# Patient Record
Sex: Female | Born: 1985 | Race: Black or African American | Hispanic: No | Marital: Married | Smoking: Current every day smoker
Health system: Southern US, Community
[De-identification: ages and names within clinical notes are randomized; demographics above are authoritative.]

## PROBLEM LIST (undated history)

## (undated) DIAGNOSIS — G43909 Migraine, unspecified, not intractable, without status migrainosus: Secondary | ICD-10-CM

## (undated) HISTORY — PX: ECTOPIC PREGNANCY SURGERY: SHX613

---

## 2021-02-09 ENCOUNTER — Emergency Department (HOSPITAL_BASED_OUTPATIENT_CLINIC_OR_DEPARTMENT_OTHER)
Admission: EM | Admit: 2021-02-09 | Discharge: 2021-02-10 | Disposition: A | Discharge status: Home / Self Care | Payer: Medicaid - Out of State | Attending: Emergency Medicine | Admitting: Emergency Medicine

## 2021-02-09 ENCOUNTER — Encounter (HOSPITAL_BASED_OUTPATIENT_CLINIC_OR_DEPARTMENT_OTHER): Payer: Self-pay | Admitting: Emergency Medicine

## 2021-02-09 ENCOUNTER — Other Ambulatory Visit: Payer: Self-pay

## 2021-02-09 DIAGNOSIS — F17210 Nicotine dependence, cigarettes, uncomplicated: Secondary | ICD-10-CM | POA: Diagnosis not present

## 2021-02-09 DIAGNOSIS — R519 Headache, unspecified: Secondary | ICD-10-CM | POA: Diagnosis present

## 2021-02-09 DIAGNOSIS — U071 COVID-19: Secondary | ICD-10-CM | POA: Insufficient documentation

## 2021-02-09 LAB — PREGNANCY, URINE: Preg Test, Ur: NEGATIVE

## 2021-02-09 LAB — RESP PANEL BY RT-PCR (FLU A&B, COVID) ARPGX2
Influenza A by PCR: NEGATIVE
Influenza B by PCR: NEGATIVE
SARS Coronavirus 2 by RT PCR: POSITIVE — AB

## 2021-02-09 MED ORDER — ONDANSETRON 4 MG PO TBDP: 4.0000 mg | ORAL_TABLET | Freq: Three times a day (TID) | ORAL | 0 refills | Status: DC | PRN

## 2021-02-09 MED ORDER — BENZONATATE 100 MG PO CAPS: 100.0000 mg | ORAL_CAPSULE | Freq: Three times a day (TID) | ORAL | 0 refills | Status: DC

## 2021-02-09 MED ORDER — KETOROLAC TROMETHAMINE 30 MG/ML IJ SOLN
30.0000 mg | Freq: Once | INTRAMUSCULAR | Status: AC
  Administered 2021-02-09: 30 mg via INTRAMUSCULAR
  Filled 2021-02-09: qty 1

## 2021-02-09 MED ORDER — NAPROXEN 500 MG PO TABS: 500.0000 mg | ORAL_TABLET | Freq: Two times a day (BID) | ORAL | 0 refills | Status: DC

## 2021-02-09 NOTE — Discharge Instructions (Addendum)
As discussed, your COVID test is positive.  I suspect your symptoms are related to COVID infection.  I am sending you home with symptomatic treatment.  Take as prescribed.  You must quarantine for 7 days away from others.  Please follow-up with PCP if symptoms not improve.  Return to the ER for new or worsening symptoms.

## 2021-02-09 NOTE — ED Notes (Signed)
Today per Pt. At approx. 1400 the Pt. Reports her head started to hurt and now her body hurts.  Pt. Crying and states she has been hurting so bad.  Pt. Continues to speak non stop about how bad she is hurting.  Pt. Lying on her stomach and asking her son to rub her back.  Pt. Up on her knees on the stretcher asking her son to rub her back.

## 2021-02-09 NOTE — ED Notes (Signed)
Pt. Is being very uncooperative and crying about everything that she wants and does not want.  Pt. Wants a hot rag and RN explained that I can't do that unless she has an order for the HOT RAG due to unsure if that is what the Dr. Dewayne Hatch for her needs.  Explained to the Pt. I will get her a warm blanket.    Pt. Was helped to the rest room for a UA sample.  Pt. Has no issues with walking. Pt. Refused a wheelchair to the restroom and refused the Migraine Cocktail offered to her.  Pt. Is in a tearful state and asking for medicine .  Explained to her she will receive meds asap.

## 2021-02-09 NOTE — ED Notes (Signed)
ED Provider at bedside. 

## 2021-02-09 NOTE — ED Notes (Signed)
Pt. Has asked for a hot rag and RN explained that the Dr. Stann Mainland be in to see her.

## 2021-02-09 NOTE — ED Provider Notes (Signed)
[facility] EMERGENCY DEPARTMENT Provider Note   CSN: [id number] Arrival date & time: 02/09/21  2134     History Chief Complaint  Patient presents with  . Flu like symptoms    Dawn Bowman is a 35 y.o. female with no significant past medical history who presents to the ED due to myalgias, headache, chills, and decreased appetite that started earlier today.  Patient states she works at [employer] and was at orientation today when she began to feel acutely ill.  Denies sick contacts known COVID exposures.  Denies chest pain and shortness of breath.  Patient states she is having lower extremity myalgias.  She also admits to a dry cough and sore throat.  He is currently vaccinated against COVID-19 however, has not received her booster shot.  Denies abdominal pain, nausea, vomiting, diarrhea.  No treatment prior to arrival.  No aggravating or alleviating symptoms.  History obtained from patient and past medical records. No interpreter used during encounter.       History reviewed. No pertinent past medical history.  There are no problems to display for this patient.   History reviewed. No pertinent surgical history.   OB History   No obstetric history on file.     No family history on file.  Social History   Tobacco Use  . Smoking status: Current Every Day Smoker    Packs/day: 0.50    Types: Cigarettes  . Smokeless tobacco: Never Used  Vaping Use  . Vaping Use: Never used  Substance Use Topics  . Alcohol use: Not Currently  . Drug use: Never    Home Medications Prior to Admission medications   Medication Sig Start Date End Date Taking? Authorizing Provider  benzonatate (TESSALON) 100 MG capsule Take 1 capsule (100 mg total) by mouth every 8 (eight) hours. 02/09/21  Yes Marveline Profeta C, PA-C  naproxen (NAPROSYN) 500 MG tablet Take 1 tablet (500 mg total) by mouth 2 (two) times daily. 02/09/21  Yes Sriya Kroeze C, PA-C  ondansetron (ZOFRAN ODT) 4 MG  disintegrating tablet Take 1 tablet (4 mg total) by mouth every 8 (eight) hours as needed for nausea or vomiting. 02/09/21  Yes Mannie Stabile, PA-C    Allergies    Patient has no known allergies.  Review of Systems   Review of Systems  Constitutional: Positive for appetite change and chills. Negative for fever.  Respiratory: Negative for shortness of breath.   Cardiovascular: Negative for chest pain.  Gastrointestinal: Negative for abdominal pain, diarrhea, nausea and vomiting.  Musculoskeletal: Positive for myalgias.  Neurological: Positive for headaches.  All other systems reviewed and are negative.   Physical Exam Updated Vital Signs BP 130/90 (BP Location: Right Arm)   Pulse 97   Temp 99.3 F (37.4 C) (Oral)   Resp 20   Ht 5\' 11"  (1.803 m)   Wt 127 kg   SpO2 98%   BMI 39.05 kg/m   Physical Exam Vitals and nursing note reviewed.  Constitutional:      General: She is not in acute distress.    Appearance: She is not ill-appearing.  HENT:     Head: Normocephalic.     Mouth/Throat:     Comments: Posterior oropharynx clear and mucous membranes moist, there is mild erythema but no edema or tonsillar exudates, uvula midline, normal phonation, no trismus, tolerating secretions without difficulty. Eyes:     Pupils: Pupils are equal, round, and reactive to light.  Cardiovascular:     Rate  and Rhythm: Normal rate and regular rhythm.     Pulses: Normal pulses.     Heart sounds: Normal heart sounds. No murmur heard. No friction rub. No gallop.   Pulmonary:     Effort: Pulmonary effort is normal.     Breath sounds: Normal breath sounds.     Comments: Respirations equal and unlabored, patient able to speak in full sentences, lungs clear to auscultation bilaterally Abdominal:     General: Abdomen is flat. There is no distension.     Palpations: Abdomen is soft.     Tenderness: There is no abdominal tenderness. There is no guarding or rebound.     Comments: Abdomen soft,  nondistended, nontender to palpation in all quadrants without guarding or peritoneal signs. No rebound.   Musculoskeletal:        General: Normal range of motion.     Cervical back: Neck supple.  Skin:    General: Skin is warm and dry.  Neurological:     General: No focal deficit present.     Mental Status: She is alert.  Psychiatric:        Mood and Affect: Mood normal.        Behavior: Behavior normal.     ED Results / Procedures / Treatments   Labs (all labs ordered are listed, but only abnormal results are displayed) Labs Reviewed  RESP PANEL BY RT-PCR (FLU A&B, COVID) ARPGX2 - Abnormal; Notable for the following components:      Result Value   SARS Coronavirus 2 by RT PCR POSITIVE (*)    All other components within normal limits  PREGNANCY, URINE    EKG None  Radiology No results found.  Procedures Procedures   Medications Ordered in ED Medications  ketorolac (TORADOL) 30 MG/ML injection 30 mg (30 mg Intramuscular Given 02/09/21 2312)    ED Course  I have reviewed the triage vital signs and the nursing notes.  Pertinent labs & imaging results that were available during my care of the patient were reviewed by me and considered in my medical decision making (see chart for details).  Clinical Course as of 02/09/21 2359  Tue Feb 09, 2021  2355 SARS Coronavirus 2 by RT PCR(!): POSITIVE [CA]    Clinical Course User Index [CA] Mannie Stabile, PA-C   MDM Rules/Calculators/A&P                         35 year old female presents to the ED due to myalgias, cough, sore throat and headache that started earlier today.  Denies sick contacts or known COVID exposures.  She is currently vaccinated against COVID-19 however, has not received her booster shot.  Upon arrival, stable vitals.  Patient nontoxic-appearing.  Physical exam reassuring.  Lungs clear to auscultation bilaterally.  Low suspicion for pneumonia.  Abdomen soft, nondistended, nontender.  Low suspicion for  acute abdomen.  Suspect symptoms related to viral etiology.  COVID/influenza test ordered.  Offered patient migraine cocktail for headache however, she declined.  Toradol given for symptomatic relief.  COVID positive. Suspect symptoms related to COVID infection. No signs of hypoxia during patient's ED stay. Patient notes improvement after Toradol. Patient discharged with symptomatic treatment. Strict ED precautions discussed with patient. Patient states understanding and agrees to plan. Patient discharged home in no acute distress and stable vitals  Dawn Bowman was evaluated in Emergency Department on 02/09/2021 for the symptoms described in the history of present illness. She was evaluated in  the context of the global COVID-19 pandemic, which necessitated consideration that the patient might be at risk for infection with the SARS-CoV-2 virus that causes COVID-19. Institutional protocols and algorithms that pertain to the evaluation of patients at risk for COVID-19 are in a state of rapid change based on information released by regulatory bodies including the CDC and federal and state organizations. These policies and algorithms were followed during the patient's care in the ED.  Final Clinical Impression(s) / ED Diagnoses Final diagnoses:  COVID-19 virus infection    Rx / DC Orders ED Discharge Orders         Ordered    ondansetron (ZOFRAN ODT) 4 MG disintegrating tablet  Every 8 hours PRN        02/09/21 2357    naproxen (NAPROSYN) 500 MG tablet  2 times daily        02/09/21 2357    benzonatate (TESSALON) 100 MG capsule  Every 8 hours        02/09/21 2357           Mannie Stabile, PA-C 02/10/21 0000    Tegeler, Canary Brim, MD 02/10/21 0006

## 2021-02-09 NOTE — ED Triage Notes (Signed)
Patient arrived via POV c/o flu like symptoms x 12 hrs. Patient states headache, chills, generalized body aches. Patient states loss of appetite. Patient is AO x 4, VS WDL, unsteady gait.

## 2021-02-10 ENCOUNTER — Other Ambulatory Visit: Payer: Self-pay | Admitting: Adult Health

## 2021-02-10 ENCOUNTER — Other Ambulatory Visit (HOSPITAL_BASED_OUTPATIENT_CLINIC_OR_DEPARTMENT_OTHER): Payer: Self-pay

## 2021-02-10 DIAGNOSIS — U071 COVID-19: Secondary | ICD-10-CM

## 2021-02-10 MED ORDER — MOLNUPIRAVIR EUA 200MG CAPSULE
4.0000 | ORAL_CAPSULE | Freq: Two times a day (BID) | ORAL | 0 refills | Status: AC
  Filled 2021-02-10: qty 40, 5d supply, fill #0

## 2021-02-10 NOTE — Progress Notes (Signed)
Outpatient Oral COVID Treatment Note  I connected with Dawn Bowman on 02/10/2021/11:39 AM by telephone and verified that I am speaking with the correct person using two identifiers.  I discussed the limitations, risks, security, and privacy concerns of performing an evaluation and management service by telephone and the availability of in person appointments. I also discussed with the patient that there may be a patient responsible charge related to this service. The patient expressed understanding and agreed to proceed.  Patient location: home Provider location: [facility]  Diagnosis: COVID-19 infection  Purpose of visit: Discussion of potential use of Molnupiravir or Paxlovid, a new treatment for mild to moderate COVID-19 viral infection in non-hospitalized patients.   Subjective: Patient is a 35 y.o. female who has been diagnosed with COVID 19 viral infection.  Their symptoms began on 02/09/2021 with myalgias.    No past medical history on file.  No Known Allergies   Current Outpatient Medications:  .  benzonatate (TESSALON) 100 MG capsule, Take 1 capsule (100 mg total) by mouth every 8 (eight) hours., Disp: 21 capsule, Rfl: 0 .  naproxen (NAPROSYN) 500 MG tablet, Take 1 tablet (500 mg total) by mouth 2 (two) times daily., Disp: 30 tablet, Rfl: 0 .  ondansetron (ZOFRAN ODT) 4 MG disintegrating tablet, Take 1 tablet (4 mg total) by mouth every 8 (eight) hours as needed for nausea or vomiting., Disp: 20 tablet, Rfl: 0  Objective: Patient appears/sounds moderately well.  They are in no apparent distress.  Breathing is non labored.  Mood and behavior are normal.  Laboratory Data:  Recent Results (from the past 2160 hour(s))  Resp Panel by RT-PCR (Flu A&B, Covid) Nasopharyngeal Swab     Status: Abnormal   Collection Time: 02/09/21 10:31 PM   Specimen: Nasopharyngeal Swab; Nasopharyngeal(NP) swabs in vial transport medium  Result Value Ref Range   SARS Coronavirus 2 by RT PCR POSITIVE  (A) NEGATIVE    Comment: RESULT CALLED TO, READ BACK BY AND VERIFIED WITH: NEAL KELLIE RN AT 2341 ON 02/09/21 BY I.SUGUT (NOTE) SARS-CoV-2 target nucleic acids are DETECTED.  The SARS-CoV-2 RNA is generally detectable in upper respiratory specimens during the acute phase of infection. Positive results are indicative of the presence of the identified virus, but do not rule out bacterial infection or co-infection with other pathogens not detected by the test. Clinical correlation with patient history and other diagnostic information is necessary to determine patient infection status. The expected result is Negative.  Fact Sheet for Patients: BloggerCourse.com  Fact Sheet for Healthcare Providers: SeriousBroker.it  This test is not yet approved or cleared by the [place] FDA and  has been authorized for detection and/or diagnosis of SARS-CoV-2 by FDA under an Emergency Use Authorization (EUA).  This EUA will remain in effect (meaning this t est can be used) for the duration of  the COVID-19 declaration under Section 564(b)(1) of the Act, 21 U.S.C. section 360bbb-3(b)(1), unless the authorization is terminated or revoked sooner.     Influenza A by PCR NEGATIVE NEGATIVE   Influenza B by PCR NEGATIVE NEGATIVE    Comment: (NOTE) The Xpert Xpress SARS-CoV-2/FLU/RSV plus assay is intended as an aid in the diagnosis of influenza from Nasopharyngeal swab specimens and should not be used as a sole basis for treatment. Nasal washings and aspirates are unacceptable for Xpert Xpress SARS-CoV-2/FLU/RSV testing.  Fact Sheet for Patients: BloggerCourse.com  Fact Sheet for Healthcare Providers: SeriousBroker.it  This test is not yet approved or cleared by the [place]  States FDA and has been authorized for detection and/or diagnosis of SARS-CoV-2 by FDA under an Emergency Use Authorization  (EUA). This EUA will remain in effect (meaning this test can be used) for the duration of the COVID-19 declaration under Section 564(b)(1) of the Act, 21 U.S.C. section 360bbb-3(b)(1), unless the authorization is terminated or revoked.  Performed at [facility], [address] [place], [place] [address]   Pregnancy, urine     Status: None   Collection Time: 02/09/21 10:31 PM  Result Value Ref Range   Preg Test, Ur NEGATIVE NEGATIVE    Comment:        THE SENSITIVITY OF THIS METHODOLOGY IS >20 mIU/mL. Performed at [facility], [address] [place], [place] [address]      Assessment: 35 y.o. female with mild/moderate COVID 19 viral infection diagnosed on 02/08/2021 at high risk for progression to severe COVID 19.  Plan:  This patient is a 35 y.o. female that meets the following criteria for Emergency Use Authorization of: Molnupiravir  1. Age >18 yr 2. SARS-COV-2 positive test 3. Symptom onset < 5 days 4. Mild-to-moderate COVID disease with high risk for severe progression to hospitalization or death   I have spoken and communicated the following to the patient or parent/caregiver regarding: 1. Molnupiravir is an unapproved drug that is authorized for use under an TEFL teacher.  2. There are no adequate, approved, available products for the treatment of COVID-19 in adults who have mild-to-moderate COVID-19 and are at high risk for progressing to severe COVID-19, including hospitalization or death. 3. Other therapeutics are currently authorized. For additional information on all products authorized for treatment or prevention of COVID-19, please see https://www.graham-miller.com/.  4. There are benefits and risks of taking this treatment as outlined in the "Fact Sheet for Patients and Caregivers."  5. "Fact Sheet for Patients and Caregivers" was  reviewed with patient. A hard copy will be provided to patient from pharmacy prior to the patient receiving treatment. 6. Patients should continue to self-isolate and use infection control measures (e.g., wear mask, isolate, social distance, avoid sharing personal items, clean and disinfect "high touch" surfaces, and frequent handwashing) according to CDC guidelines.  7. The patient or parent/caregiver has the option to accept or refuse treatment. 8. [employer] has established a pregnancy surveillance program. 9. Females of childbearing potential should use a reliable method of contraception correctly and consistently, as applicable, for the duration of treatment and for 4 days after the last dose of Molnupiravir. 10. Males of reproductive potential who are sexually active with females of childbearing potential should use a reliable method of contraception correctly and consistently during treatment and for at least 3 months after the last dose. 11. Pregnancy status and risk was assessed. Patient verbalized understanding of precautions.   After reviewing above information with the patient, the patient agrees to receive molnupiravir.  Follow up instructions:    . Take prescription BID x 5 days as directed . Reach out to pharmacist for counseling on medication if desired . For concerns regarding further COVID symptoms please follow up with your PCP or urgent care . For urgent or life-threatening issues, seek care at your local emergency department  The patient was provided an opportunity to ask questions, and all were answered. The patient agreed with the plan and demonstrated an understanding of the instructions.   Script sent to [facility] and opted to pick up RX.  The patient was advised to call their PCP or seek an in-person evaluation if the symptoms worsen or if the condition fails to improve as anticipated.   I provided 15 minutes of non face-to-face telephone visit  time during this encounter, and > 50% was spent counseling as documented under my assessment & plan.  Noreene Filbert, NP 02/10/2021 /11:39 AM

## 2021-02-17 ENCOUNTER — Other Ambulatory Visit (HOSPITAL_BASED_OUTPATIENT_CLINIC_OR_DEPARTMENT_OTHER): Payer: Self-pay

## 2021-04-07 ENCOUNTER — Emergency Department (HOSPITAL_BASED_OUTPATIENT_CLINIC_OR_DEPARTMENT_OTHER)
Admission: EM | Admit: 2021-04-07 | Discharge: 2021-04-07 | Disposition: A | Discharge status: Home / Self Care | Payer: Medicaid - Out of State | Attending: Emergency Medicine | Admitting: Emergency Medicine

## 2021-04-07 ENCOUNTER — Other Ambulatory Visit: Payer: Self-pay

## 2021-04-07 ENCOUNTER — Encounter (HOSPITAL_BASED_OUTPATIENT_CLINIC_OR_DEPARTMENT_OTHER): Payer: Self-pay

## 2021-04-07 DIAGNOSIS — X58XXXA Exposure to other specified factors, initial encounter: Secondary | ICD-10-CM | POA: Insufficient documentation

## 2021-04-07 DIAGNOSIS — B9689 Other specified bacterial agents as the cause of diseases classified elsewhere: Secondary | ICD-10-CM | POA: Insufficient documentation

## 2021-04-07 DIAGNOSIS — L089 Local infection of the skin and subcutaneous tissue, unspecified: Secondary | ICD-10-CM | POA: Insufficient documentation

## 2021-04-07 DIAGNOSIS — F17210 Nicotine dependence, cigarettes, uncomplicated: Secondary | ICD-10-CM | POA: Diagnosis not present

## 2021-04-07 DIAGNOSIS — R102 Pelvic and perineal pain: Secondary | ICD-10-CM | POA: Diagnosis not present

## 2021-04-07 DIAGNOSIS — S6992XA Unspecified injury of left wrist, hand and finger(s), initial encounter: Secondary | ICD-10-CM | POA: Insufficient documentation

## 2021-04-07 DIAGNOSIS — N760 Acute vaginitis: Secondary | ICD-10-CM | POA: Insufficient documentation

## 2021-04-07 LAB — URINALYSIS, ROUTINE W REFLEX MICROSCOPIC
Bilirubin Urine: NEGATIVE
Glucose, UA: NEGATIVE mg/dL
Hgb urine dipstick: NEGATIVE
Ketones, ur: NEGATIVE mg/dL
Leukocytes,Ua: NEGATIVE
Nitrite: NEGATIVE
Protein, ur: NEGATIVE mg/dL
Specific Gravity, Urine: >1.03 — ABNORMAL HIGH (ref 1.005–1.030)
pH: 6 (ref 5.0–8.0)

## 2021-04-07 LAB — WET PREP, GENITAL
Sperm: NONE SEEN
Trich, Wet Prep: NONE SEEN
Yeast Wet Prep HPF POC: NONE SEEN

## 2021-04-07 LAB — PREGNANCY, URINE: Preg Test, Ur: NEGATIVE

## 2021-04-07 MED ORDER — METRONIDAZOLE 500 MG PO TABS: 500.0000 mg | ORAL_TABLET | Freq: Two times a day (BID) | ORAL | 0 refills | Status: DC

## 2021-04-07 MED ORDER — CEPHALEXIN 250 MG PO CAPS
500.0000 mg | ORAL_CAPSULE | Freq: Once | ORAL | Status: AC
  Administered 2021-04-07: 500 mg via ORAL
  Filled 2021-04-07: qty 2

## 2021-04-07 MED ORDER — CEPHALEXIN 500 MG PO CAPS: 500.0000 mg | ORAL_CAPSULE | Freq: Two times a day (BID) | ORAL | 0 refills | Status: DC

## 2021-04-07 NOTE — ED Provider Notes (Signed)
[facility] EMERGENCY DEPARTMENT Provider Note   CSN: [id number] Arrival date & time: 04/07/21  0050     History Chief Complaint  Patient presents with   Pelvic Pain   Finger Injury    Dawn Bowman is a 35 y.o. female.  The history is provided by the patient.  Vaginal Discharge Severity:  Mild Onset quality:  Gradual Duration:  2 days Timing:  Intermittent Progression:  Worsening Chronicity:  New Relieved by:  Nothing Worsened by:  Nothing Associated symptoms: vaginal itching   Associated symptoms: no fever   Patient presents for 2 complaints.  She reports for the past 2 days she has had symptoms of bacterial vaginosis.  She reports vaginal itching and discharge.  No bleeding.  She has had mild pelvic pain.  Patient also reports a lesion to her left index finger.  She had a callus on her finger and applied Compound W to the area. Now the  Area is swollen and tender.  No fevers     PMH-none Past Surgical History:  Procedure Laterality Date   CESAREAN SECTION     ECTOPIC PREGNANCY SURGERY       OB History   No obstetric history on file.     No family history on file.  Social History   Tobacco Use   Smoking status: Every Day    Packs/day: 0.50    Pack years: 0.00    Types: Cigarettes   Smokeless tobacco: Never  Vaping Use   Vaping Use: Never used  Substance Use Topics   Alcohol use: Yes    Comment: occ   Drug use: Never    Home Medications Prior to Admission medications   Medication Sig Start Date End Date Taking? Authorizing Provider  cephALEXin (KEFLEX) 500 MG capsule Take 1 capsule (500 mg total) by mouth 2 (two) times daily. 04/07/21  Yes Zadie Rhine, MD  metroNIDAZOLE (FLAGYL) 500 MG tablet Take 1 tablet (500 mg total) by mouth 2 (two) times daily. One po bid x 7 days 04/07/21  Yes Zadie Rhine, MD  benzonatate (TESSALON) 100 MG capsule Take 1 capsule (100 mg total) by mouth every 8 (eight) hours. 02/09/21   Mannie Stabile,  PA-C  naproxen (NAPROSYN) 500 MG tablet Take 1 tablet (500 mg total) by mouth 2 (two) times daily. 02/09/21   Mannie Stabile, PA-C  ondansetron (ZOFRAN ODT) 4 MG disintegrating tablet Take 1 tablet (4 mg total) by mouth every 8 (eight) hours as needed for nausea or vomiting. 02/09/21   Mannie Stabile, PA-C    Allergies    Patient has no known allergies.  Review of Systems   Review of Systems  Constitutional:  Negative for fever.  Genitourinary:  Positive for vaginal discharge.   Physical Exam Updated Vital Signs BP 121/70 (BP Location: Right Arm)   Pulse 69   Temp 98.1 F (36.7 C) (Oral)   Resp 16   Ht 1.803 m (5\' 11" )   Wt 126.6 kg   SpO2 99%   BMI 38.91 kg/m   Physical Exam CONSTITUTIONAL: Well developed/well nourished HEAD: Normocephalic/atraumatic EYES: EOMI ENMT: Mucous membranes moist NECK: supple no meningeal signs CV: S1/S2 noted, no murmurs/rubs/gallops noted LUNGS: Lungs are clear to auscultation bilaterally ABDOMEN: soft, nontender, no rebound or guarding, bowel sounds noted throughout abdomen GU: Pelvic deferred, patient self swab NEURO: Pt is awake/alert/appropriate, moves all extremitiesx4.  No facial droop.   EXTREMITIES: pulses normal/equal, full ROM, see photo below Mild tenderness noted to  left index finger.  There is no fluctuance.  No paronychia.  Tenderness is mostly over the callus.  No tenderness noted to the proximal aspect of the index finger. No crepitus is noted SKIN: warm, color normal, see photo below PSYCH: no abnormalities of mood noted, alert and oriented to situation  Patient gave verbal permission to utilize photo for medical documentation only The image was not stored on any personal device ED Results / Procedures / Treatments   Labs (all labs ordered are listed, but only abnormal results are displayed) Labs Reviewed  WET PREP, GENITAL - Abnormal; Notable for the following components:      Result Value   Clue Cells Wet Prep  HPF POC PRESENT (*)    WBC, Wet Prep HPF POC FEW (*)    All other components within normal limits  URINALYSIS, ROUTINE W REFLEX MICROSCOPIC - Abnormal; Notable for the following components:   APPearance HAZY (*)    Specific Gravity, Urine >1.030 (*)    All other components within normal limits  PREGNANCY, URINE  GC/CHLAMYDIA PROBE AMP ([place]) NOT AT [facility]    EKG None  Radiology No results found.  Procedures Procedures   Medications Ordered in ED Medications  cephALEXin (KEFLEX) capsule 500 mg (500 mg Oral Given 04/07/21 0540)    ED Course  I have reviewed the triage vital signs and the nursing notes.  Pertinent labs results that were available during my care of the patient were reviewed by me and considered in my medical decision making (see chart for details).    MDM Rules/Calculators/A&P                          The patient's BV, she self swab in the emergency department.  She has tolerated Flagyl in the past.  This will be given to patient for the next week.  Advised no alcohol consumption until she completes Flagyl  For the lesion on her finger, suspect this is a secondary infection from the recent callus.  She thought it got worse after applying Compound W to the lesion There is no fluctuance at this time, no paronychia to suggest need for I&D.  No evidence of felon.  No significant pain with passive flexion, low suspicion for tenosynovitis Plan to keep finger elevated, warm compresses, will start oral antibiotics with Keflex. We discussed strict return precautions Final Clinical Impression(s) / ED Diagnoses Final diagnoses:  BV (bacterial vaginosis)  Finger infection    Rx / DC Orders ED Discharge Orders          Ordered    metroNIDAZOLE (FLAGYL) 500 MG tablet  2 times daily        04/07/21 0500    cephALEXin (KEFLEX) 500 MG capsule  2 times daily        04/07/21 0500             Zadie Rhine, MD 04/07/21 [phone]

## 2021-04-07 NOTE — ED Notes (Signed)
Pt made aware repeat urine needed.

## 2021-04-07 NOTE — ED Triage Notes (Signed)
Pt presents with multiple complaints.   1) Pt states she had a callous on her L index finger that she applied Compound W to and the area turned white.   2) Pt reports pelvic pain and states she thinks she has BV. Pt reports vaginal itching and discharge.

## 2021-04-07 NOTE — ED Notes (Signed)
Pt self swabbed per EDP  

## 2021-04-07 NOTE — Discharge Instructions (Addendum)
Keep your finger clean and dry.  Please keep the finger elevated.  You can also place warm compresses on the wound several times a day. If there is any worsening pain, swelling in the next 2 days please return to the ER

## 2021-04-08 LAB — GC/CHLAMYDIA PROBE AMP (~~LOC~~) NOT AT ARMC
Chlamydia: NEGATIVE
Comment: NEGATIVE
Comment: NORMAL
Neisseria Gonorrhea: NEGATIVE

## 2021-08-11 ENCOUNTER — Emergency Department (HOSPITAL_BASED_OUTPATIENT_CLINIC_OR_DEPARTMENT_OTHER)
Admission: EM | Admit: 2021-08-11 | Discharge: 2021-08-11 | Disposition: A | Discharge status: Home / Self Care | Payer: Medicaid Other | Attending: Emergency Medicine | Admitting: Emergency Medicine

## 2021-08-11 ENCOUNTER — Other Ambulatory Visit: Payer: Self-pay

## 2021-08-11 ENCOUNTER — Encounter (HOSPITAL_BASED_OUTPATIENT_CLINIC_OR_DEPARTMENT_OTHER): Payer: Self-pay | Admitting: Emergency Medicine

## 2021-08-11 DIAGNOSIS — K029 Dental caries, unspecified: Secondary | ICD-10-CM | POA: Diagnosis not present

## 2021-08-11 DIAGNOSIS — K0889 Other specified disorders of teeth and supporting structures: Secondary | ICD-10-CM | POA: Diagnosis present

## 2021-08-11 DIAGNOSIS — F17210 Nicotine dependence, cigarettes, uncomplicated: Secondary | ICD-10-CM | POA: Diagnosis not present

## 2021-08-11 MED ORDER — AMOXICILLIN 500 MG PO CAPS
500.0000 mg | ORAL_CAPSULE | Freq: Once | ORAL | Status: AC
  Administered 2021-08-11: 500 mg via ORAL
  Filled 2021-08-11: qty 1

## 2021-08-11 MED ORDER — FLUCONAZOLE 150 MG PO TABS: ORAL_TABLET | ORAL | 0 refills | Status: AC

## 2021-08-11 MED ORDER — IBUPROFEN 800 MG PO TABS
800.0000 mg | ORAL_TABLET | Freq: Once | ORAL | Status: AC
  Administered 2021-08-11: 800 mg via ORAL
  Filled 2021-08-11: qty 1

## 2021-08-11 MED ORDER — IBUPROFEN 800 MG PO TABS: 800.0000 mg | ORAL_TABLET | Freq: Three times a day (TID) | ORAL | 0 refills | Status: AC | PRN

## 2021-08-11 MED ORDER — AMOXICILLIN 500 MG PO CAPS: 500.0000 mg | ORAL_CAPSULE | Freq: Three times a day (TID) | ORAL | 0 refills | Status: DC

## 2021-08-11 NOTE — ED Triage Notes (Signed)
Pt has 4 teeth on right side with pain .

## 2021-08-11 NOTE — ED Notes (Signed)
Discharge instructions discussed with pt. Pt verbalized understanding. Pt stable and ambulatory.  °

## 2021-08-11 NOTE — ED Provider Notes (Signed)
[facility] Provider Note: Lowella Dell, MD, FACEP  CSN: [id number] MRN: [id number] ARRIVAL: 08/11/21 at 0130 ROOM: [facility]07/[facility]07   CHIEF COMPLAINT  Dental Pain   HISTORY OF PRESENT ILLNESS  08/11/21 4:17 AM Dawn Bowman is a 35 y.o. 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  She is here with an exacerbation of pain in these teeth.  The pain is now radiating to the right side of the face.  She rates it as a 6 out of 10.  She is gotten partial relief with Aleve but recently moved to town and has no Radiation protection practitioner.   History reviewed. No pertinent past medical history.  Past Surgical History:  Procedure Laterality Date   CESAREAN SECTION     ECTOPIC PREGNANCY SURGERY      History reviewed. No pertinent family history.  Social History   Tobacco Use   Smoking status: Every Day    Packs/day: 0.50    Types: Cigarettes   Smokeless tobacco: Never  Vaping Use   Vaping Use: Never used  Substance Use Topics   Alcohol use: Yes    Comment: occ   Drug use: Never    Prior to Admission medications   Medication Sig Start Date End Date Taking? Authorizing Provider  amoxicillin (AMOXIL) 500 MG capsule Take 1 capsule (500 mg total) by mouth 3 (three) times daily. 08/11/21  Yes Maan Zarcone, MD  fluconazole (DIFLUCAN) 150 MG tablet Take 1 tablet as needed for vaginal yeast infection.  May repeat in 3 days if symptoms persist. 08/11/21  Yes Shelonda Saxe, Jonny Ruiz, MD  ibuprofen (ADVIL) 800 MG tablet Take 1 tablet (800 mg total) by mouth every 8 (eight) hours as needed (for pain). 08/11/21  Yes Caera Enwright, MD    Allergies Patient has no known allergies.   REVIEW OF SYSTEMS  Negative except as noted here or in the History of Present Illness.   PHYSICAL EXAMINATION  Initial Vital Signs Blood pressure (!) 143/109, pulse 80, temperature 98.7 F (37.1 C), temperature source Oral, resp. rate 18, height 5\' 11"  (1.803 m), weight  127 kg, SpO2 99 %.  Examination General: Well-developed, well-nourished female in no acute distress; appearance consistent with age of record HENT: normocephalic; atraumatic; carious right upper and lower molars Eyes: pupils equal, round and reactive to light; extraocular muscles intact Neck: supple Heart: regular rate and rhythm Lungs: clear to auscultation bilaterally Abdomen: soft; nondistended; nontender; bowel sounds present Extremities: No deformity; full range of motion Neurologic: Awake, alert and oriented; motor function intact in all extremities and symmetric; no facial droop Skin: Warm and dry Psychiatric: Normal mood and affect   RESULTS  Summary of this visit's results, reviewed and interpreted by myself:   EKG Interpretation  Date/Time:    Ventricular Rate:    PR Interval:    QRS Duration:   QT Interval:    QTC Calculation:   R Axis:     Text Interpretation:         Laboratory Studies: No results found for this or any previous visit (from the past 24 hour(s)). Imaging Studies: No results found.  ED COURSE and MDM  Nursing notes, initial and subsequent vitals signs, including pulse oximetry, reviewed and interpreted by myself.  Vitals:   08/11/21 0142 08/11/21 0149  BP:  (!) 143/109  Pulse:  80  Resp:  18  Temp:  98.7 F (37.1 C)  TempSrc:  Oral  SpO2:  99%  Weight: 127 kg   Height: 5\' 11"  (1.803 m)    Medications  amoxicillin (AMOXIL) capsule 500 mg (has no administration in time range)  ibuprofen (ADVIL) tablet 800 mg (has no administration in time range)    Patient would like to be started on antibiotics and ibuprofen.  She does not like narcotic pain medication.  We will refer her to the dentist and oral surgeon on-call.  PROCEDURES  Procedures   ED DIAGNOSES     ICD-10-CM   1. Pain due to dental caries  K02.9          Zoriyah Scheidegger, MD 08/11/21 0430

## 2021-10-26 ENCOUNTER — Emergency Department (HOSPITAL_BASED_OUTPATIENT_CLINIC_OR_DEPARTMENT_OTHER)
Admission: EM | Admit: 2021-10-26 | Discharge: 2021-10-26 | Disposition: A | Discharge status: Home / Self Care | Payer: Medicaid Other | Attending: Emergency Medicine | Admitting: Emergency Medicine

## 2021-10-26 ENCOUNTER — Other Ambulatory Visit: Payer: Self-pay

## 2021-10-26 ENCOUNTER — Encounter (HOSPITAL_BASED_OUTPATIENT_CLINIC_OR_DEPARTMENT_OTHER): Payer: Self-pay | Admitting: *Deleted

## 2021-10-26 DIAGNOSIS — N751 Abscess of Bartholin's gland: Secondary | ICD-10-CM | POA: Insufficient documentation

## 2021-10-26 DIAGNOSIS — N611 Abscess of the breast and nipple: Secondary | ICD-10-CM

## 2021-10-26 MED ORDER — DOXYCYCLINE HYCLATE 100 MG PO TABS
100.0000 mg | ORAL_TABLET | Freq: Once | ORAL | Status: AC
  Administered 2021-10-26: 100 mg via ORAL
  Filled 2021-10-26: qty 1

## 2021-10-26 MED ORDER — PENTAFLUOROPROP-TETRAFLUOROETH EX AERO: INHALATION_SPRAY | CUTANEOUS | Status: DC | PRN

## 2021-10-26 MED ORDER — DOXYCYCLINE HYCLATE 100 MG PO CAPS
100.0000 mg | ORAL_CAPSULE | Freq: Two times a day (BID) | ORAL | 0 refills | Status: DC
  Filled 2021-10-26: qty 14, 7d supply, fill #0

## 2021-10-26 NOTE — ED Provider Notes (Signed)
[facility] EMERGENCY DEPARTMENT Provider Note   CSN: [id number] Arrival date & time: 10/26/21  1846     History  Chief Complaint  Patient presents with   Abscess    breast    Dawn Bowman is a 36 y.o. female.   Abscess Patient presents with left-sided breast abscess.  History of same.  Is had for around 3 days.  States she has had multiple abscesses over different parts of her body.  Is not diabetic.  States that it works best when he used a cold spray and then cut it.  No fevers.  States recently moved to [place] and does not have the doctors here yet.    Home Medications Prior to Admission medications   Medication Sig Start Date End Date Taking? Authorizing Provider  doxycycline (VIBRAMYCIN) 100 MG capsule Take 1 capsule (100 mg total) by mouth 2 (two) times daily. 10/26/21  Yes Benjiman Core, MD  fluconazole (DIFLUCAN) 150 MG tablet Take 1 tablet as needed for vaginal yeast infection.  May repeat in 3 days if symptoms persist. 08/11/21   Molpus, Jonny Ruiz, MD  ibuprofen (ADVIL) 800 MG tablet Take 1 tablet (800 mg total) by mouth every 8 (eight) hours as needed (for pain). 08/11/21   Molpus, Jonny Ruiz, MD      Allergies    Patient has no known allergies.    Review of Systems   Review of Systems  Constitutional:  Negative for appetite change.  Respiratory:  Negative for shortness of breath.   Cardiovascular:  Negative for chest pain.  Gastrointestinal:  Negative for abdominal pain.  Skin:  Positive for wound.  Neurological:  Negative for weakness.  Psychiatric/Behavioral:  Negative for confusion.    Physical Exam Updated Vital Signs BP (!) 141/93    Pulse 92    Temp 98.2 F (36.8 C) (Oral)    Resp 18    Ht 5\' 11"  (1.803 m)    Wt 131.5 kg    SpO2 99%    BMI 40.45 kg/m  Physical Exam Vitals and nursing note reviewed.  Pulmonary:     Comments: Large fluctuant abscess on the left breast.  Approximately 6 cm across.  In the middle of it there is an area of scar  from previous incision and drainage.  It is on the medial distal aspect of the breast abutting up to the nipple. Abdominal:     Tenderness: There is no abdominal tenderness.  Skin:    General: Skin is warm.     Capillary Refill: Capillary refill takes less than 2 seconds.  Neurological:     Mental Status: She is alert.    ED Results / Procedures / Treatments   Labs (all labs ordered are listed, but only abnormal results are displayed) Labs Reviewed - No data to display  EKG None  Radiology No results found.  Procedures . Incision and Drainage  Date/Time: 10/26/2021 9:01 PM Performed by: 10/28/2021, MD Authorized by: Benjiman Core, MD   Consent:    Consent obtained:  Verbal   Consent given by:  Patient   Risks, benefits, and alternatives were discussed: yes     Risks discussed:  Bleeding, infection and damage to other organs   Alternatives discussed:  No treatment, delayed treatment and alternative treatment Universal protocol:    Procedure explained and questions answered to patient or proxy's satisfaction: yes   Location:    Type:  Bartholin cyst   Size:  6 cm   Location:  Trunk  Trunk location:  L breast Pre-procedure details:    Procedure prep: Alcohol. Sedation:    Sedation type:  None Anesthesia:    Anesthesia method:  Topical application   Topical anesthesia: Cooling spray. Procedure type:    Complexity:  Simple Procedure details:    Ultrasound guidance: yes     Needle aspiration: no     Incision types:  Single straight   Incision depth:  Subcutaneous   Wound management:  Irrigated with saline   Drainage:  Purulent   Drainage amount:  Copious   Wound treatment:  Wound left open   Packing materials:  None Post-procedure details:    Procedure completion:  Tolerated well, no immediate complications    Medications Ordered in ED Medications  pentafluoroprop-tetrafluoroeth (GEBAUERS) aerosol (has no administration in time range)   doxycycline (VIBRA-TABS) tablet 100 mg (100 mg Oral Given 10/26/21 2056)    ED Course/ Medical Decision Making/ A&P                           Medical Decision Making Risk Prescription drug management.   Patient with large abscess on her left breast.  History of same.Patient has had previous drainages of these.  I think she is a good candidate for it here also.  I think cosmetically would also give her a good outcome.  Large amount of drainage.  Still some induration after but much improved.  Will have follow-up with general surgery as needed.  Will give antibiotics.  Discharge home.  Does not appear to need blood work at this time.  Is not appear septic.        Final Clinical Impression(s) / ED Diagnoses Final diagnoses:  Breast abscess    Rx / DC Orders ED Discharge Orders          Ordered    doxycycline (VIBRAMYCIN) 100 MG capsule  2 times daily        10/26/21 2051              Benjiman Core, MD 10/26/21 2103

## 2021-10-26 NOTE — ED Triage Notes (Signed)
C/o abscess to left breast x 3 days hx of same

## 2021-10-27 ENCOUNTER — Other Ambulatory Visit (HOSPITAL_BASED_OUTPATIENT_CLINIC_OR_DEPARTMENT_OTHER): Payer: Self-pay

## 2021-10-28 ENCOUNTER — Encounter (HOSPITAL_BASED_OUTPATIENT_CLINIC_OR_DEPARTMENT_OTHER): Payer: Self-pay | Admitting: *Deleted

## 2021-10-28 ENCOUNTER — Emergency Department (HOSPITAL_BASED_OUTPATIENT_CLINIC_OR_DEPARTMENT_OTHER)
Admission: EM | Admit: 2021-10-28 | Discharge: 2021-10-28 | Disposition: A | Discharge status: Home / Self Care | Payer: Medicaid Other | Attending: Emergency Medicine | Admitting: Emergency Medicine

## 2021-10-28 ENCOUNTER — Other Ambulatory Visit: Payer: Self-pay

## 2021-10-28 DIAGNOSIS — Z5321 Procedure and treatment not carried out due to patient leaving prior to being seen by health care provider: Secondary | ICD-10-CM | POA: Diagnosis not present

## 2021-10-28 DIAGNOSIS — R030 Elevated blood-pressure reading, without diagnosis of hypertension: Secondary | ICD-10-CM | POA: Diagnosis present

## 2021-10-28 NOTE — ED Triage Notes (Signed)
States she wants her BP rechecked. She was seen here 2 days ago for another problem and her BP was elevated.

## 2021-12-21 ENCOUNTER — Other Ambulatory Visit: Payer: Self-pay

## 2021-12-21 ENCOUNTER — Emergency Department (HOSPITAL_BASED_OUTPATIENT_CLINIC_OR_DEPARTMENT_OTHER): Payer: Medicaid Other

## 2021-12-21 ENCOUNTER — Encounter (HOSPITAL_BASED_OUTPATIENT_CLINIC_OR_DEPARTMENT_OTHER): Payer: Self-pay | Admitting: Urology

## 2021-12-21 ENCOUNTER — Emergency Department (HOSPITAL_BASED_OUTPATIENT_CLINIC_OR_DEPARTMENT_OTHER)
Admission: EM | Admit: 2021-12-21 | Discharge: 2021-12-22 | Disposition: A | Discharge status: Home / Self Care | Payer: Medicaid Other | Attending: Emergency Medicine | Admitting: Emergency Medicine

## 2021-12-21 DIAGNOSIS — R112 Nausea with vomiting, unspecified: Secondary | ICD-10-CM | POA: Diagnosis not present

## 2021-12-21 DIAGNOSIS — Z20822 Contact with and (suspected) exposure to covid-19: Secondary | ICD-10-CM | POA: Insufficient documentation

## 2021-12-21 DIAGNOSIS — I10 Essential (primary) hypertension: Secondary | ICD-10-CM | POA: Insufficient documentation

## 2021-12-21 DIAGNOSIS — F17200 Nicotine dependence, unspecified, uncomplicated: Secondary | ICD-10-CM | POA: Insufficient documentation

## 2021-12-21 DIAGNOSIS — D72829 Elevated white blood cell count, unspecified: Secondary | ICD-10-CM | POA: Insufficient documentation

## 2021-12-21 DIAGNOSIS — R519 Headache, unspecified: Secondary | ICD-10-CM

## 2021-12-21 DIAGNOSIS — E876 Hypokalemia: Secondary | ICD-10-CM | POA: Insufficient documentation

## 2021-12-21 LAB — BASIC METABOLIC PANEL
Anion gap: 9 (ref 5–15)
BUN: 14 mg/dL (ref 6–20)
CO2: 23 mmol/L (ref 22–32)
Calcium: 8.4 mg/dL — ABNORMAL LOW (ref 8.9–10.3)
Chloride: 104 mmol/L (ref 98–111)
Creatinine, Ser: 0.91 mg/dL (ref 0.44–1.00)
GFR, Estimated: >60 mL/min (ref >60)
Glucose, Bld: 114 mg/dL — ABNORMAL HIGH (ref 70–99)
Potassium: 3.1 mmol/L — ABNORMAL LOW (ref 3.5–5.1)
Sodium: 136 mmol/L (ref 135–145)

## 2021-12-21 LAB — CBC
HCT: 42.9 % (ref 36.0–46.0)
Hemoglobin: 14.4 g/dL (ref 12.0–15.0)
MCH: 29.8 pg (ref 26.0–34.0)
MCHC: 33.6 g/dL (ref 30.0–36.0)
MCV: 88.8 fL (ref 80.0–100.0)
Platelets: 300 10*3/uL (ref 150–400)
RBC: 4.83 MIL/uL (ref 3.87–5.11)
RDW: 12.9 % (ref 11.5–15.5)
WBC: 11.1 10*3/uL — ABNORMAL HIGH (ref 4.0–10.5)
nRBC: 0 % (ref 0.0–0.2)

## 2021-12-21 LAB — RESP PANEL BY RT-PCR (FLU A&B, COVID) ARPGX2
Influenza A by PCR: NEGATIVE
Influenza B by PCR: NEGATIVE
SARS Coronavirus 2 by RT PCR: NEGATIVE

## 2021-12-21 LAB — TROPONIN I (HIGH SENSITIVITY): Troponin I (High Sensitivity): 2 ng/L (ref <18)

## 2021-12-21 LAB — CBG MONITORING, ED: Glucose-Capillary: 101 mg/dL — ABNORMAL HIGH (ref 70–99)

## 2021-12-21 MED ORDER — HYDROMORPHONE HCL 1 MG/ML IJ SOLN
1.0000 mg | Freq: Once | INTRAMUSCULAR | Status: AC
  Administered 2021-12-21: 1 mg via INTRAVENOUS
  Filled 2021-12-21: qty 1

## 2021-12-21 MED ORDER — ONDANSETRON 4 MG PO TBDP: 4.0000 mg | ORAL_TABLET | Freq: Three times a day (TID) | ORAL | 1 refills | Status: AC | PRN

## 2021-12-21 MED ORDER — SODIUM CHLORIDE 0.9 % IV BOLUS
1000.0000 mL | Freq: Once | INTRAVENOUS | Status: AC
Start: 2021-12-21 — End: 2021-12-22
  Administered 2021-12-21: 1000 mL via INTRAVENOUS

## 2021-12-21 MED ORDER — ONDANSETRON HCL 4 MG/2ML IJ SOLN
4.0000 mg | Freq: Once | INTRAMUSCULAR | Status: AC
  Administered 2021-12-21: 4 mg via INTRAVENOUS
  Filled 2021-12-21: qty 2

## 2021-12-21 MED ORDER — SODIUM CHLORIDE 0.9 % IV SOLN: INTRAVENOUS | Status: DC

## 2021-12-21 NOTE — ED Provider Notes (Signed)
[facility] EMERGENCY DEPARTMENT Provider Note   CSN: [id number] Arrival date & time: 12/21/21  2049     History  Chief Complaint  Patient presents with   Hypertension    Dawn Bowman is a 36 y.o. female.  Patient states that her blood pressure has been up for the past few days.  Patient does not have a known history of hypertension.  Not on any medication.  Is an every day smoker still.  Past medical history noncontributory.  Patient states that starting today she started not feeling well.  Got nausea then upon arrival here and vomiting also had a dull headache this morning and then that got worse.  She has a history of migraines but states this is not like her migraines at all.  No diarrhea.  Here is thrown up a few times.  No neurodeficits no weakness no numbness no visual changes other than a little bit of light sensitivity.  No fevers.  No upper respiratory symptoms.  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  Blood pressure upon arrival here was 146/101.  But when I saw her blood pressure was 144/88.      Home Medications Prior to Admission medications   Medication Sig Start Date End Date Taking? Authorizing Provider  ondansetron (ZOFRAN-ODT) 4 MG disintegrating tablet Take 1 tablet (4 mg total) by mouth every 8 (eight) hours as needed for nausea or vomiting. 12/21/21  Yes Vanetta Mulders, MD  doxycycline (VIBRAMYCIN) 100 MG capsule Take 1 capsule (100 mg total) by mouth 2 (two) times daily. 10/26/21   Benjiman Core, MD  fluconazole (DIFLUCAN) 150 MG tablet Take 1 tablet as needed for vaginal yeast infection.  May repeat in 3 days if symptoms persist. 08/11/21   Molpus, Jonny Ruiz, MD  ibuprofen (ADVIL) 800 MG tablet Take 1 tablet (800 mg total) by mouth every 8 (eight) hours as needed (for pain). 08/11/21   Molpus, Jonny Ruiz, MD      Allergies    Patient has no known allergies.    Review of Systems   Review of Systems   Constitutional:  Negative for chills and fever.  HENT:  Negative for ear pain and sore throat.   Eyes:  Positive for photophobia. Negative for pain and visual disturbance.  Respiratory:  Negative for cough and shortness of breath.   Cardiovascular:  Negative for chest pain and palpitations.  Gastrointestinal:  Positive for nausea and vomiting. Negative for abdominal pain.  Genitourinary:  Negative for dysuria and hematuria.  Musculoskeletal:  Negative for arthralgias and back pain.  Skin:  Negative for color change and rash.  Neurological:  Positive for headaches. Negative for seizures and syncope.  All other systems reviewed and are negative.  Physical Exam Updated Vital Signs BP (!) 130/105   Pulse 88   Temp (!) 97.5 F (36.4 C) (Oral)   Resp 16   Ht 1.803 m (5\' 11" )   Wt 131.5 kg   SpO2 100%   BMI 40.43 kg/m  Physical Exam Vitals and nursing note reviewed.  Constitutional:      General: She is not in acute distress.    Appearance: Normal appearance. She is well-developed. She is not ill-appearing or toxic-appearing.  HENT:     Head: Normocephalic and atraumatic.     Mouth/Throat:     Mouth: Mucous membranes are moist.  Eyes:     Extraocular Movements: Extraocular movements intact.     Conjunctiva/sclera: Conjunctivae  normal.     Pupils: Pupils are equal, round, and reactive to light.  Cardiovascular:     Rate and Rhythm: Normal rate and regular rhythm.     Heart sounds: No murmur heard. Pulmonary:     Effort: Pulmonary effort is normal. No respiratory distress.     Breath sounds: Normal breath sounds.  Abdominal:     Palpations: Abdomen is soft.     Tenderness: There is no abdominal tenderness.  Musculoskeletal:        General: No swelling.     Cervical back: Normal range of motion and neck supple. No rigidity or tenderness.  Skin:    General: Skin is warm and dry.     Capillary Refill: Capillary refill takes less than 2 seconds.  Neurological:     General:  No focal deficit present.     Mental Status: She is alert and oriented to person, place, and time.     Cranial Nerves: No cranial nerve deficit.     Sensory: No sensory deficit.     Motor: No weakness.  Psychiatric:        Mood and Affect: Mood normal.    ED Results / Procedures / Treatments   Labs (all labs ordered are listed, but only abnormal results are displayed) Labs Reviewed  BASIC METABOLIC PANEL - Abnormal; Notable for the following components:      Result Value   Potassium 3.1 (*)    Glucose, Bld 114 (*)    Calcium 8.4 (*)    All other components within normal limits  CBC - Abnormal; Notable for the following components:   WBC 11.1 (*)    All other components within normal limits  CBG MONITORING, ED - Abnormal; Notable for the following components:   Glucose-Capillary 101 (*)    All other components within normal limits  RESP PANEL BY RT-PCR (FLU A&B, COVID) [id number]  TROPONIN I (HIGH SENSITIVITY)  TROPONIN I (HIGH SENSITIVITY)    EKG EKG Interpretation  Date/Time:  Tuesday December 21 2021 21:04:26 EDT Ventricular Rate:  71 PR Interval:  151 QRS Duration: 91 QT Interval:  424 QTC Calculation: 461 R Axis:   43 Text Interpretation: Sinus rhythm Low voltage, precordial leads Confirmed by Vanetta Mulders [phone]) on 12/21/2021 10:37:55 PM  Radiology DG Chest 2 View  Result Date: 12/21/2021 CLINICAL DATA:  Hypertension. EXAM: CHEST - 2 VIEW COMPARISON:  None. FINDINGS: The heart size and mediastinal contours are within normal limits. Both lungs are clear. The visualized skeletal structures are unremarkable. IMPRESSION: No active cardiopulmonary disease. Electronically Signed   By: Elgie Collard M.D.   On: 12/21/2021 21:25   CT Head Wo Contrast  Result Date: 12/21/2021 CLINICAL DATA:  Sudden severe headaches. Nausea. Hypertension for 3 days. EXAM: CT HEAD WITHOUT CONTRAST TECHNIQUE: Contiguous axial images were obtained from the base of the skull through the vertex  without intravenous contrast. RADIATION DOSE REDUCTION: This exam was performed according to the departmental dose-optimization program which includes automated exposure control, adjustment of the mA and/or kV according to patient size and/or use of iterative reconstruction technique. COMPARISON:  None. FINDINGS: Brain: No evidence of acute infarction, hemorrhage, hydrocephalus, extra-axial collection or mass lesion/mass effect. Vascular: No hyperdense vessel or unexpected calcification. Skull: Normal. Negative for fracture or focal lesion. Sinuses/Orbits: No acute finding. Other: None. IMPRESSION: No acute intracranial abnormalities. Electronically Signed   By: Burman Nieves M.D.   On: 12/21/2021 23:13    Procedures Procedures    Medications Ordered  in ED Medications  0.9 %  sodium chloride infusion (has no administration in time range)  sodium chloride 0.9 % bolus 1,000 mL (1,000 mLs Intravenous New Bag/Given 12/21/21 2305)  ondansetron (ZOFRAN) injection 4 mg (4 mg Intravenous Given 12/21/21 2305)  HYDROmorphone (DILAUDID) injection 1 mg (1 mg Intravenous Given 12/21/21 2305)    ED Course/ Medical Decision Making/ A&P                           Medical Decision Making Amount and/or Complexity of Data Reviewed Labs: ordered. Radiology: ordered.  Risk Prescription drug management.  Patient after IV fluids, and Zofran, and hydromorphone.  Patient feeling much better.  COVID testing was negative.  Labs only significant for a mild hypokalemia with a potassium of 3.1.  But otherwise normal.  Mild leukocytosis at 11.1 hemoglobin was normal.  Pone troponin was done although patient denied any chest pain it was ordered at triage and that was 2.  Does not need a delta troponin.  Patient's CT head without any acute findings at all and chest x-ray was negative.  EKG without any acute findings.  Patient feeling much better.  Patient will follow-up with her primary care doctor regarding the blood  pressure.  And will keep a log.  Exact cause of the other symptoms still could possibly be a viral illness even though COVID and flu was negative.  The patient nontoxic no acute distress stable for discharge home will be given work note.   Final Clinical Impression(s) / ED Diagnoses Final diagnoses:  Primary hypertension  Acute intractable headache, unspecified headache type    Rx / DC Orders ED Discharge Orders          Ordered    ondansetron (ZOFRAN-ODT) 4 MG disintegrating tablet  Every 8 hours PRN        12/21/21 2354              Vanetta Mulders, MD 12/22/21 0001

## 2021-12-21 NOTE — ED Triage Notes (Signed)
Pt states high blood pressure x 3 days ?States today started feeling shaky and nauseated  ?No h/o HTN per pt ?

## 2021-12-21 NOTE — Discharge Instructions (Signed)
COVID influenza testing negative.  Blood pressure doing better with improvement of the headache.  Your symptoms could all still be viral based.  We not know the real cause.  Follow-up with your doctor regarding the blood pressure.  Recommend keeping a blood pressure log to see if you truly are trending high enough to start blood pressure medicine.  Return for any new or worse symptoms.  Work note provided. ?

## 2021-12-21 NOTE — ED Notes (Signed)
Patient transported to X-ray 

## 2022-03-30 ENCOUNTER — Other Ambulatory Visit: Payer: Self-pay

## 2022-03-30 ENCOUNTER — Emergency Department (HOSPITAL_BASED_OUTPATIENT_CLINIC_OR_DEPARTMENT_OTHER)
Admission: EM | Admit: 2022-03-30 | Discharge: 2022-03-31 | Disposition: A | Discharge status: Home / Self Care | Payer: Medicaid Other | Attending: Emergency Medicine | Admitting: Emergency Medicine

## 2022-03-30 ENCOUNTER — Encounter (HOSPITAL_BASED_OUTPATIENT_CLINIC_OR_DEPARTMENT_OTHER): Payer: Self-pay | Admitting: Emergency Medicine

## 2022-03-30 DIAGNOSIS — R519 Headache, unspecified: Secondary | ICD-10-CM | POA: Diagnosis present

## 2022-03-30 DIAGNOSIS — G43909 Migraine, unspecified, not intractable, without status migrainosus: Secondary | ICD-10-CM | POA: Insufficient documentation

## 2022-03-30 DIAGNOSIS — G43009 Migraine without aura, not intractable, without status migrainosus: Secondary | ICD-10-CM

## 2022-03-30 HISTORY — DX: Migraine, unspecified, not intractable, without status migrainosus: G43.909

## 2022-03-30 MED ORDER — SUMATRIPTAN SUCCINATE 50 MG PO TABS: 100.0000 mg | ORAL_TABLET | Freq: Once | ORAL | 0 refills | Status: AC | PRN

## 2022-03-30 MED ORDER — MAGNESIUM SULFATE 2 GM/50ML IV SOLN
2.0000 g | Freq: Once | INTRAVENOUS | Status: AC
  Administered 2022-03-30: 2 g via INTRAVENOUS
  Filled 2022-03-30: qty 50

## 2022-03-30 MED ORDER — ACETAMINOPHEN 325 MG PO TABS
650.0000 mg | ORAL_TABLET | Freq: Once | ORAL | Status: AC
  Administered 2022-03-30: 650 mg via ORAL
  Filled 2022-03-30: qty 2

## 2022-03-30 MED ORDER — SODIUM CHLORIDE 0.9 % IV BOLUS
1000.0000 mL | Freq: Once | INTRAVENOUS | Status: AC
  Administered 2022-03-30: 1000 mL via INTRAVENOUS

## 2022-03-30 MED ORDER — PROCHLORPERAZINE EDISYLATE 10 MG/2ML IJ SOLN
10.0000 mg | Freq: Once | INTRAMUSCULAR | Status: AC
  Administered 2022-03-30: 10 mg via INTRAVENOUS
  Filled 2022-03-30: qty 2

## 2022-03-30 MED ORDER — KETOROLAC TROMETHAMINE 15 MG/ML IJ SOLN
15.0000 mg | Freq: Once | INTRAMUSCULAR | Status: AC
  Administered 2022-03-30: 15 mg via INTRAVENOUS
  Filled 2022-03-30: qty 1

## 2022-03-30 NOTE — Discharge Instructions (Addendum)
Please establish care with a primary care doctor. They may refer you to neurology if you continue to have migraine symptoms. You can use the medication I am prescribing as needed for severe migraines. Limit use to 10 times in one month. I recommend plenty of fluids, rest.

## 2022-03-30 NOTE — ED Provider Notes (Signed)
[facility] EMERGENCY DEPARTMENT Provider Note   CSN: [id number] Arrival date & time: 03/30/22  2141     History  Chief Complaint  Patient presents with   Migraine    Dawn Bowman is a 36 y.o. female with past medical history significant for migraines who presents with migraine for the last 3 days.  Patient reports is typical of her normal migraines with photosensitivity, nausea, vomiting.  She denies any numbness, tingling, any urinary retention or fecal retention.  She denies any diplopia, balance issues.  Patient reports she has had poor effects with narcotic pain medication and Benadryl in the past and requests a migraine medication that does not contain his medications.  She denies any chest pain, shortness of breath, fever, chills.   Migraine Associated symptoms include headaches.       Home Medications Prior to Admission medications   Medication Sig Start Date End Date Taking? Authorizing Provider  SUMAtriptan (IMITREX) 50 MG tablet Take 2 tablets (100 mg total) by mouth once as needed for up to 1 dose for migraine. May repeat in 2 hours if headache persists or recurs. 03/30/22  Yes Medhansh Brinkmeier H, PA-C  doxycycline (VIBRAMYCIN) 100 MG capsule Take 1 capsule (100 mg total) by mouth 2 (two) times daily. 10/26/21   Benjiman Core, MD  fluconazole (DIFLUCAN) 150 MG tablet Take 1 tablet as needed for vaginal yeast infection.  May repeat in 3 days if symptoms persist. 08/11/21   Molpus, Jonny Ruiz, MD  ibuprofen (ADVIL) 800 MG tablet Take 1 tablet (800 mg total) by mouth every 8 (eight) hours as needed (for pain). 08/11/21   Molpus, Jonny Ruiz, MD  ondansetron (ZOFRAN-ODT) 4 MG disintegrating tablet Take 1 tablet (4 mg total) by mouth every 8 (eight) hours as needed for nausea or vomiting. 12/21/21   Vanetta Mulders, MD      Allergies    Patient has no known allergies.    Review of Systems   Review of Systems  Neurological:  Positive for headaches.  All other systems  reviewed and are negative.   Physical Exam Updated Vital Signs BP (!) 138/91 (BP Location: Left Arm)   Pulse 81   Temp 98.1 F (36.7 C) (Oral)   Resp 20   Ht 5\' 10"  (1.778 m)   Wt 132.5 kg   SpO2 99%   BMI 41.90 kg/m  Physical Exam Vitals and nursing note reviewed.  Constitutional:      General: She is not in acute distress.    Appearance: Normal appearance.  HENT:     Head: Normocephalic and atraumatic.  Eyes:     General:        Right eye: No discharge.        Left eye: No discharge.  Cardiovascular:     Rate and Rhythm: Normal rate and regular rhythm.     Heart sounds: No murmur heard.    No friction rub. No gallop.  Pulmonary:     Effort: Pulmonary effort is normal.     Breath sounds: Normal breath sounds.  Abdominal:     General: Bowel sounds are normal.     Palpations: Abdomen is soft.  Skin:    General: Skin is warm and dry.     Capillary Refill: Capillary refill takes less than 2 seconds.  Neurological:     Mental Status: She is alert and oriented to person, place, and time.     Comments: Cranial nerves II through XII grossly intact.  Intact finger-nose, intact  heel-to-shin.  Romberg negative, gait normal.  Alert and oriented x3.  Moves all 4 limbs spontaneously, normal coordination.  No pronator drift.  Intact strength 5 out of 5 bilateral upper and lower extremities.    Psychiatric:        Mood and Affect: Mood normal.        Behavior: Behavior normal.     ED Results / Procedures / Treatments   Labs (all labs ordered are listed, but only abnormal results are displayed) Labs Reviewed - No data to display  EKG None  Radiology No results found.  Procedures Procedures    Medications Ordered in ED Medications  sodium chloride 0.9 % bolus 1,000 mL ( Intravenous Stopped 03/30/22 2353)  ketorolac (TORADOL) 15 MG/ML injection 15 mg (15 mg Intravenous Given 03/30/22 2258)  acetaminophen (TYLENOL) tablet 650 mg (650 mg Oral Given 03/30/22 2253)   prochlorperazine (COMPAZINE) injection 10 mg (10 mg Intravenous Given 03/30/22 2258)  magnesium sulfate IVPB 2 g 50 mL (0 g Intravenous Stopped 03/30/22 2353)    ED Course/ Medical Decision Making/ A&P                           Medical Decision Making Risk OTC drugs. Prescription drug management.   This patient is a 36 y.o. female who presents to the ED for concern of headache, which feels like her typical migraines, this involves an extensive number of treatment options, and is a complaint that carries with it a high risk of complications and morbidity. The emergent differential diagnosis prior to evaluation includes, but is not limited to, dural venous thrombosis, severe intractable migraine, status migrainosus, intracranial abnormality, subarachnoid hemorrhage, neoplasm of the head, versus other.   This is not an exhaustive differential.   Past Medical History / Co-morbidities / Social History: Previous history of migraines, obesity  Additional history: Chart reviewed. Pertinent results include: Reviewed previous lab work and imaging from emergency department evaluations.  Patient has been seen multiple times for migraines in this hospital system and other hospital systems.  She reports she received Dilaudid last time which I validated and she does not want to receive Dilaudid again today.  Physical Exam: Physical exam performed. The pertinent findings include: Patient with no neurologic deficits on my exam, she does have some photosensitivity   Medications: I ordered medication including migraine cocktail minus Benadryl for headache. Reevaluation of the patient after these medicines showed that the patient improved. I have reviewed the patients home medicines and have made adjustments as needed.   Disposition: After consideration of the diagnostic results and the patients response to treatment, I feel that patient appears stable for discharge at this time, I will provide her a short  course of Imitrex as she is been unable to find a PCP, or any prophylactic medications.  Limited course given, patient informed that she should not take medication for headaches greater than 10 days/month or is at risk for rebound /medication overuse headaches..   emergency department workup does not suggest an emergent condition requiring admission or immediate intervention beyond what has been performed at this time. The patient is safe for discharge and has been instructed to return immediately for worsening symptoms, change in symptoms or any other concerns.  I discussed this case with my attending physician Dr. Rush Landmark who cosigned this note including patient's presenting symptoms, physical exam, and planned diagnostics and interventions. Attending physician stated agreement with plan or made changes to plan which  were implemented.    Final Clinical Impression(s) / ED Diagnoses Final diagnoses:  Migraine without aura and without status migrainosus, not intractable    Rx / DC Orders ED Discharge Orders          Ordered    SUMAtriptan (IMITREX) 50 MG tablet  Once PRN        03/30/22 2339              Olene Floss, [place] 03/31/22 1450    Tegeler, Canary Brim, MD 03/31/22 [phone]

## 2022-03-30 NOTE — ED Triage Notes (Addendum)
Pt states Hx of migraines has Hx of same, is new to this area and has not established a PCP and is unable to get a Rx. Has tried home remedies. Pt request no narcotics.

## 2022-03-30 NOTE — ED Provider Notes (Incomplete)
[facility] EMERGENCY DEPARTMENT Provider Note   CSN: [id number] Arrival date & time: 03/30/22  2141     History {Add pertinent medical, surgical, social history, OB history to HPI:1} Chief Complaint  Patient presents with  . Migraine    Nautika Carreras is a 36 y.o. female with past medical history significant for migraines who presents with migraine for the last 3 days.  Patient reports is typical of her normal migraines with photosensitivity, nausea, vomiting.  She denies any numbness, tingling, any urinary retention or fecal retention.  She denies any diplopia, balance issues.  Patient reports she has had poor effects with narcotic pain medication and Benadryl in the past and requests a migraine medication that does not contain his medications.  She denies any chest pain, shortness of breath, fever, chills.   Migraine Associated symptoms include headaches.       Home Medications Prior to Admission medications   Medication Sig Start Date End Date Taking? Authorizing Provider  doxycycline (VIBRAMYCIN) 100 MG capsule Take 1 capsule (100 mg total) by mouth 2 (two) times daily. 10/26/21   Benjiman Core, MD  fluconazole (DIFLUCAN) 150 MG tablet Take 1 tablet as needed for vaginal yeast infection.  May repeat in 3 days if symptoms persist. 08/11/21   Molpus, Jonny Ruiz, MD  ibuprofen (ADVIL) 800 MG tablet Take 1 tablet (800 mg total) by mouth every 8 (eight) hours as needed (for pain). 08/11/21   Molpus, Jonny Ruiz, MD  ondansetron (ZOFRAN-ODT) 4 MG disintegrating tablet Take 1 tablet (4 mg total) by mouth every 8 (eight) hours as needed for nausea or vomiting. 12/21/21   Vanetta Mulders, MD      Allergies    Patient has no known allergies.    Review of Systems   Review of Systems  Neurological:  Positive for headaches.  All other systems reviewed and are negative.   Physical Exam Updated Vital Signs BP (!) 143/105 (BP Location: Left Arm)   Pulse 86   Temp 98.1 F (36.7 C)  (Oral)   Resp 20   Ht 5\' 10"  (1.778 m)   Wt 132.5 kg   SpO2 100%   BMI 41.90 kg/m  Physical Exam Vitals and nursing note reviewed.  Constitutional:      General: She is not in acute distress.    Appearance: Normal appearance.  HENT:     Head: Normocephalic and atraumatic.  Eyes:     General:        Right eye: No discharge.        Left eye: No discharge.  Cardiovascular:     Rate and Rhythm: Normal rate and regular rhythm.     Heart sounds: No murmur heard.    No friction rub. No gallop.  Pulmonary:     Effort: Pulmonary effort is normal.     Breath sounds: Normal breath sounds.  Abdominal:     General: Bowel sounds are normal.     Palpations: Abdomen is soft.  Skin:    General: Skin is warm and dry.     Capillary Refill: Capillary refill takes less than 2 seconds.  Neurological:     Mental Status: She is alert and oriented to person, place, and time.     Comments: Cranial nerves II through XII grossly intact.  Intact finger-nose, intact heel-to-shin.  Romberg negative, gait normal.  Alert and oriented x3.  Moves all 4 limbs spontaneously, normal coordination.  No pronator drift.  Intact strength 5 out of 5 bilateral upper  and lower extremities.    Psychiatric:        Mood and Affect: Mood normal.        Behavior: Behavior normal.     ED Results / Procedures / Treatments   Labs (all labs ordered are listed, but only abnormal results are displayed) Labs Reviewed - No data to display  EKG None  Radiology No results found.  Procedures Procedures  {Document cardiac monitor, telemetry assessment procedure when appropriate:1}  Medications Ordered in ED Medications  sodium chloride 0.9 % bolus 1,000 mL (has no administration in time range)  ketorolac (TORADOL) 15 MG/ML injection 15 mg (has no administration in time range)  acetaminophen (TYLENOL) tablet 650 mg (has no administration in time range)  prochlorperazine (COMPAZINE) injection 10 mg (has no  administration in time range)  magnesium sulfate IVPB 2 g 50 mL (has no administration in time range)    ED Course/ Medical Decision Making/ A&P                           Medical Decision Making Risk OTC drugs. Prescription drug management.   This patient is a 36 y.o. female who presents to the ED for concern of ***, this involves an extensive number of treatment options, and is a complaint that carries with it a high risk of complications and morbidity. The emergent differential diagnosis prior to evaluation includes, but is not limited to,  *** .   This is not an exhaustive differential.   Past Medical History / Co-morbidities / Social History: ***  Additional history: Chart reviewed. Pertinent results include: ***  Physical Exam: Physical exam performed. The pertinent findings include: ***  Lab Tests: I ordered, and personally interpreted labs.  The pertinent results include:  ***   Imaging Studies: I ordered imaging studies including ***. I independently visualized and interpreted imaging which showed ***. I agree with the radiologist interpretation.   Cardiac Monitoring:  The patient was maintained on a cardiac monitor.  My attending physician Dr. Marland Kitchen viewed and interpreted the cardiac monitored which showed an underlying rhythm of: ***. I agree with this interpretation.   Medications: I ordered medication including ***  for ***. Reevaluation of the patient after these medicines showed that the patient {resolved/improved/worsened:23923::"improved"}. I have reviewed the patients home medicines and have made adjustments as needed.  Consultations Obtained: I requested consultation with the ***,  and discussed lab and imaging findings as well as pertinent plan - they recommend: ***   Disposition: After consideration of the diagnostic results and the patients response to treatment, I feel that *** .   ***emergency department workup does not suggest an emergent condition  requiring admission or immediate intervention beyond what has been performed at this time. The patient is safe for discharge and has been instructed to return immediately for worsening symptoms, change in symptoms or any other concerns.  I discussed this case with my attending physician Dr. Marland Kitchen who cosigned this note including patient's presenting symptoms, physical exam, and planned diagnostics and interventions. Attending physician stated agreement with plan or made changes to plan which were implemented.    Final Clinical Impression(s) / ED Diagnoses Final diagnoses:  None    Rx / DC Orders ED Discharge Orders     None

## 2022-11-21 ENCOUNTER — Emergency Department (HOSPITAL_BASED_OUTPATIENT_CLINIC_OR_DEPARTMENT_OTHER)
Admission: EM | Admit: 2022-11-21 | Discharge: 2022-11-21 | Disposition: A | Discharge status: Home / Self Care | Payer: Medicaid Other | Attending: Emergency Medicine | Admitting: Emergency Medicine

## 2022-11-21 ENCOUNTER — Other Ambulatory Visit: Payer: Self-pay

## 2022-11-21 DIAGNOSIS — N611 Abscess of the breast and nipple: Secondary | ICD-10-CM | POA: Diagnosis present

## 2022-11-21 LAB — CBC WITH DIFFERENTIAL/PLATELET
Abs Immature Granulocytes: 0.02 10*3/uL (ref 0.00–0.07)
Basophils Absolute: 0.1 10*3/uL (ref 0.0–0.1)
Basophils Relative: 1 %
Eosinophils Absolute: 0.3 10*3/uL (ref 0.0–0.5)
Eosinophils Relative: 3 %
HCT: 42.5 % (ref 36.0–46.0)
Hemoglobin: 14.2 g/dL (ref 12.0–15.0)
Immature Granulocytes: 0 %
Lymphocytes Relative: 31 %
Lymphs Abs: 3 10*3/uL (ref 0.7–4.0)
MCH: 29.3 pg (ref 26.0–34.0)
MCHC: 33.4 g/dL (ref 30.0–36.0)
MCV: 87.6 fL (ref 80.0–100.0)
Monocytes Absolute: 0.6 10*3/uL (ref 0.1–1.0)
Monocytes Relative: 6 %
Neutro Abs: 5.8 10*3/uL (ref 1.7–7.7)
Neutrophils Relative %: 59 %
Platelets: 320 10*3/uL (ref 150–400)
RBC: 4.85 MIL/uL (ref 3.87–5.11)
RDW: 12.7 % (ref 11.5–15.5)
WBC: 9.8 10*3/uL (ref 4.0–10.5)
nRBC: 0 % (ref 0.0–0.2)

## 2022-11-21 LAB — BASIC METABOLIC PANEL
Anion gap: 6 (ref 5–15)
BUN: 13 mg/dL (ref 6–20)
CO2: 24 mmol/L (ref 22–32)
Calcium: 8.3 mg/dL — ABNORMAL LOW (ref 8.9–10.3)
Chloride: 106 mmol/L (ref 98–111)
Creatinine, Ser: 0.89 mg/dL (ref 0.44–1.00)
GFR, Estimated: >60 mL/min (ref >60)
Glucose, Bld: 102 mg/dL — ABNORMAL HIGH (ref 70–99)
Potassium: 3.5 mmol/L (ref 3.5–5.1)
Sodium: 136 mmol/L (ref 135–145)

## 2022-11-21 MED ORDER — PENTAFLUOROPROP-TETRAFLUOROETH EX AERO: INHALATION_SPRAY | CUTANEOUS | Status: DC | PRN

## 2022-11-21 NOTE — ED Provider Notes (Signed)
[facility] Provider Note   CSN: [id number] Arrival date & time: 11/21/22  1543     History  Chief Complaint  Patient presents with   Abscess    Dawn Bowman is a 37 y.o. female with past medical history significant for recurrent breast abscess who presents with concern for breast abscess on the left.  Patient reports that it started couple weeks ago, she had it drained recently at the surgery center, but that it filled up with fluid again shortly thereafter.  Patient reports she is not currently taking antibiotics.  She has a follow-up appointment with breast center on 2/16.  She reports that she knows that there is some cosmetic risk of drainage in the emergency department but she would like it drained today nonetheless.   Abscess      Home Medications Prior to Admission medications   Medication Sig Start Date End Date Taking? Authorizing Provider  doxycycline (VIBRAMYCIN) 100 MG capsule Take 1 capsule (100 mg total) by mouth 2 (two) times daily. 10/26/21   Davonna Belling, MD  fluconazole (DIFLUCAN) 150 MG tablet Take 1 tablet as needed for vaginal yeast infection.  May repeat in 3 days if symptoms persist. 08/11/21   Molpus, Jenny Reichmann, MD  ibuprofen (ADVIL) 800 MG tablet Take 1 tablet (800 mg total) by mouth every 8 (eight) hours as needed (for pain). 08/11/21   Molpus, Jenny Reichmann, MD  ondansetron (ZOFRAN-ODT) 4 MG disintegrating tablet Take 1 tablet (4 mg total) by mouth every 8 (eight) hours as needed for nausea or vomiting. 12/21/21   Fredia Sorrow, MD  SUMAtriptan (IMITREX) 50 MG tablet Take 2 tablets (100 mg total) by mouth once as needed for up to 1 dose for migraine. May repeat in 2 hours if headache persists or recurs. 03/30/22   Shlonda Dolloff H, PA-C      Allergies    Patient has no known allergies.    Review of Systems   Review of Systems  All other systems reviewed and are negative.   Physical Exam Updated Vital  Signs BP (!) 127/95 (BP Location: Right Arm)   Pulse 91   Temp 99 F (37.2 C) (Oral)   Resp 18   Ht 5' 10"$  (1.778 m)   Wt 133.8 kg   SpO2 99%   BMI 42.33 kg/m  Physical Exam Vitals and nursing note reviewed.  Constitutional:      General: She is not in acute distress.    Appearance: Normal appearance.  HENT:     Head: Normocephalic and atraumatic.  Eyes:     General:        Right eye: No discharge.        Left eye: No discharge.  Cardiovascular:     Rate and Rhythm: Normal rate and regular rhythm.  Pulmonary:     Effort: Pulmonary effort is normal. No respiratory distress.  Musculoskeletal:        General: No deformity.  Skin:    General: Skin is warm and dry.     Comments: With red, indurated tissue with a perhaps 5 cm area of fluctuance to the left of the areola on the left breast.  Neurological:     Mental Status: She is alert and oriented to person, place, and time.  Psychiatric:        Mood and Affect: Mood normal.        Behavior: Behavior normal.     ED Results / Procedures / Treatments  Labs (all labs ordered are listed, but only abnormal results are displayed) Labs Reviewed  BASIC METABOLIC PANEL - Abnormal; Notable for the following components:      Result Value   Glucose, Bld 102 (*)    Calcium 8.3 (*)    All other components within normal limits  CBC WITH DIFFERENTIAL/PLATELET    EKG None  Radiology No results found.  Procedures .Marland KitchenIncision and Drainage  Date/Time: 11/21/2022 6:41 PM  Performed by: Anselmo Pickler, PA-C Authorized by: Anselmo Pickler, PA-C   Consent:    Consent obtained:  Verbal   Consent given by:  Patient   Risks, benefits, and alternatives were discussed: yes     Risks discussed:  Bleeding, incomplete drainage, pain, infection and damage to other organs   Alternatives discussed:  No treatment Universal protocol:    Procedure explained and questions answered to patient or proxy's satisfaction: yes      Patient identity confirmed:  Verbally with patient Location:    Type:  Abscess   Size:  5cm   Location:  Trunk   Trunk location:  L breast Pre-procedure details:    Skin preparation:  Povidone-iodine Sedation:    Sedation type:  None Anesthesia:    Anesthesia method:  Topical application   Topical anesthesia: Cold spray. Procedure type:    Complexity:  Complex Procedure details:    Ultrasound guidance: no     Needle aspiration: no     Incision types:  Single straight   Incision depth:  Dermal   Wound management:  Probed and deloculated   Drainage:  Purulent and bloody   Drainage amount:  Copious   Wound treatment:  Drain placed   Packing materials:  1/4 in iodoform gauze Post-procedure details:    Procedure completion:  Tolerated     Medications Ordered in ED Medications  pentafluoroprop-tetrafluoroeth (GEBAUERS) aerosol (has no administration in time range)    ED Course/ Medical Decision Making/ A&P                             Medical Decision Making Amount and/or Complexity of Data Reviewed Labs: ordered.   This patient is a 37 y.o. female  who presents to the ED for concern of recurrent breast abscess.   Differential diagnoses prior to evaluation: The emergent differential diagnosis includes, but is not limited to,  HS, mastitis, abscess, breast cancer, vs other . This is not an exhaustive differential.   Past Medical History / Co-morbidities: HS, recurrent breast abscess  Additional history: Chart reviewed. Pertinent results include: reviewed previous ED evaluations for same, as well as evaluations at [facility] with drainage just earlier this week  Physical Exam: Physical exam performed. The pertinent findings include: fluctuant red abscess on left breast, copious purulent drainage  Lab Tests/Imaging studies: I personally interpreted labs/imaging and the pertinent results include:  cbc unremarkable, bmp overall unremarkable.    Abscess drained as  documented above. Patient instructed to continue antibiotics, and keep follow up with breast center.   Disposition: After consideration of the diagnostic results and the patients response to treatment, I feel that patient is stable for discharge at this time after successful drainage as discussed above .   emergency department workup does not suggest an emergent condition requiring admission or immediate intervention beyond what has been performed at this time. The plan is: as above. The patient is safe for discharge and has been instructed to return immediately for  worsening symptoms, change in symptoms or any other concerns.  Final Clinical Impression(s) / ED Diagnoses Final diagnoses:  Abscess of breast    Rx / DC Orders ED Discharge Orders     None         Anselmo Pickler, PA-C 11/21/22 [place], Dan, DO 11/21/22 2027

## 2022-11-21 NOTE — Discharge Instructions (Signed)
Please use Tylenol or ibuprofen for pain.  You may use 600 mg ibuprofen every 6 hours or 1000 mg of Tylenol every 6 hours.  You may choose to alternate between the 2.  This would be most effective.  Not to exceed 4 g of Tylenol within 24 hours.  Not to exceed 3200 mg ibuprofen 24 hours.  Please course of antibiotics that you are prescribed and follow-up with the breast center as you have planned.  Please return to the emergency department you have worsening pain, redness, or recurrence of the abscess on your breast.

## 2022-11-21 NOTE — ED Notes (Signed)
I&D tray at bedside with gebauers spray

## 2022-11-21 NOTE — ED Triage Notes (Signed)
Pt arrives with c/o left breast abscess that started a couple of weeks ago. Per pt, she had it drained and it has started to reappear again. Pt denies fever. Pt has hx of breast abscess.

## 2022-11-21 NOTE — ED Notes (Signed)
Pt stable at time of discharge. Pt A&OX4. RR even and unlabored. No acute distress noted. Pt verbalized understanding of discharge instructions as described.

## 2023-06-14 IMAGING — CT CT HEAD W/O CM
3 series · 16 of 47 positions shown, 19 images · non-contrast
Comparison: None.

CLINICAL DATA: Sudden severe headaches. Nausea. Hypertension for 3
days.



[Series 2: head wo · axial · 0.46mm/px · z∈[+153,+298]mm · 10 of 35 slices shown, 13 images]
[im 3/35  brain]
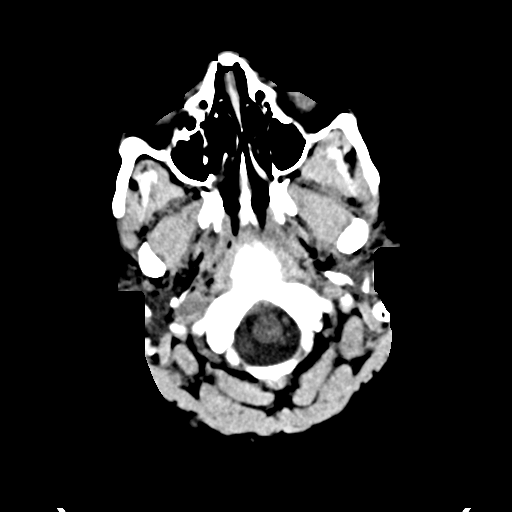
[im 3/35  bone]
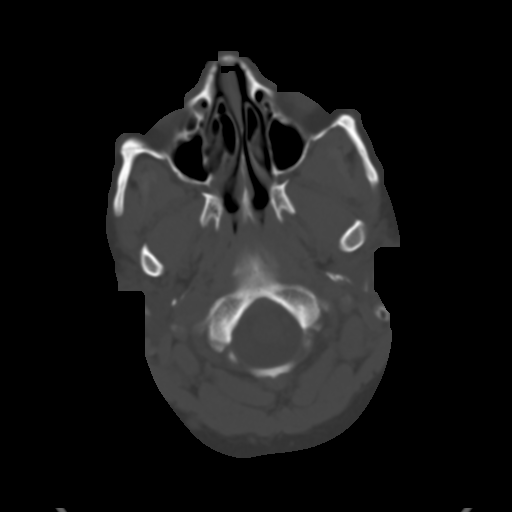
[im 6/35  brain]
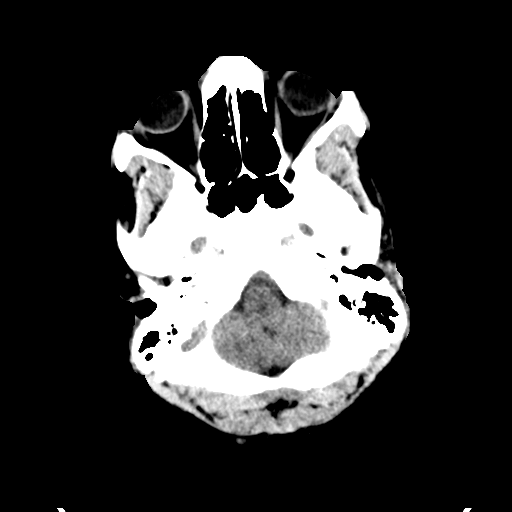
[im 10/35  brain]
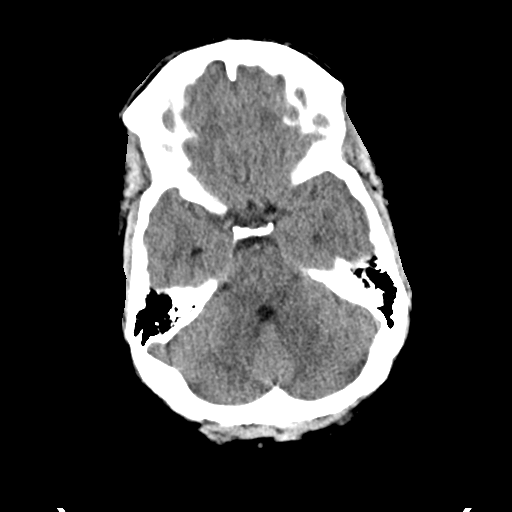
[im 12/35  brain]
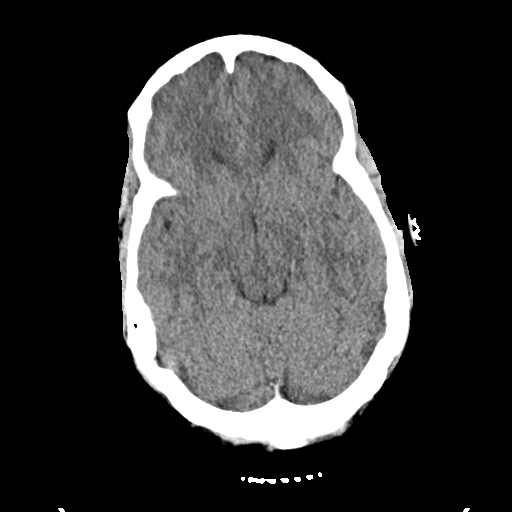
[im 16/35  brain]
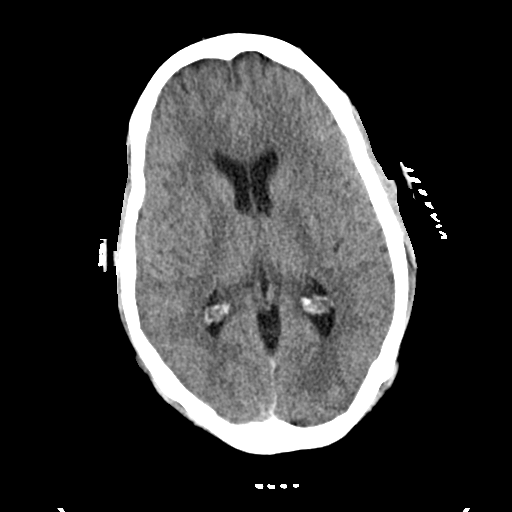
[im 16/35  bone]
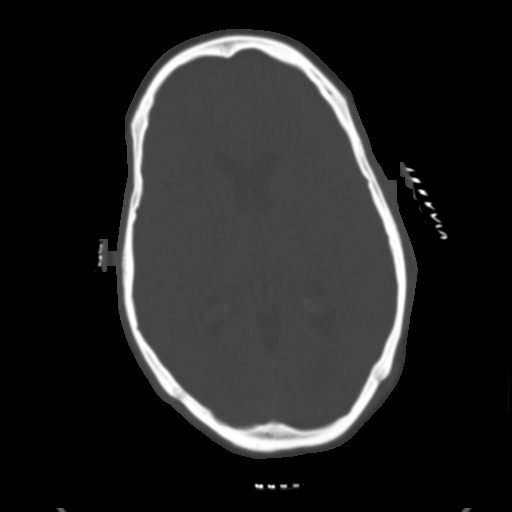
[im 19/35  brain]
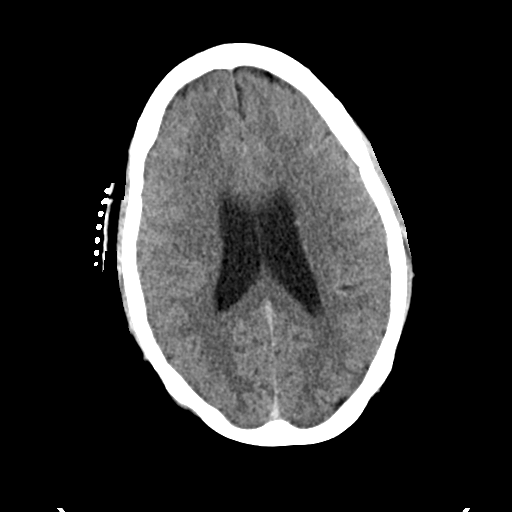
[im 23/35  brain]
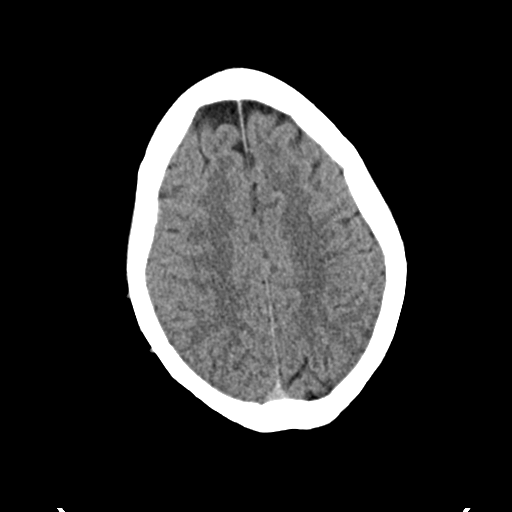
[im 26/35  brain]
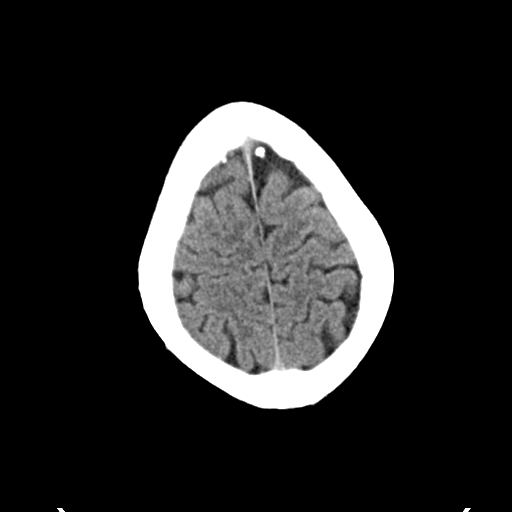
[im 29/35  brain]
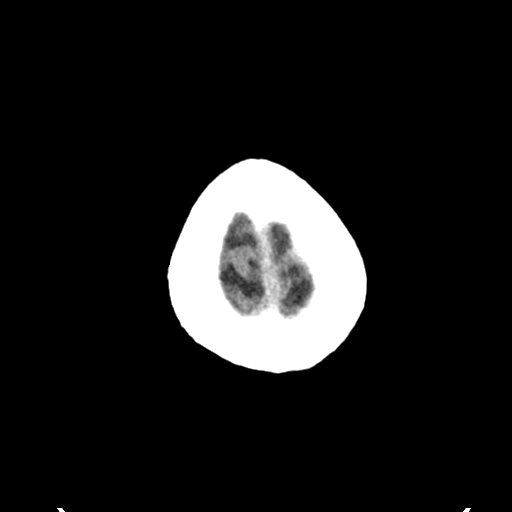
[im 29/35  bone]
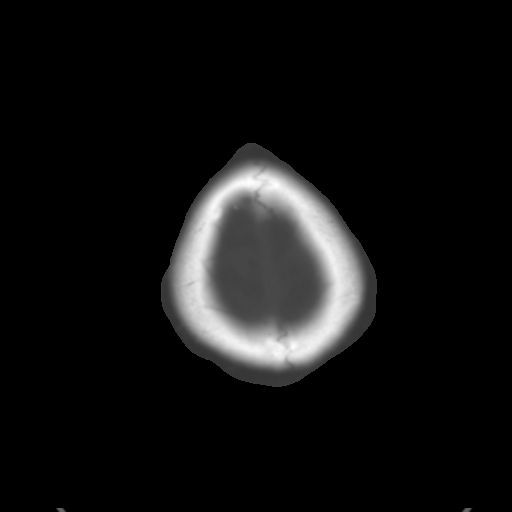
[im 32/35  brain]
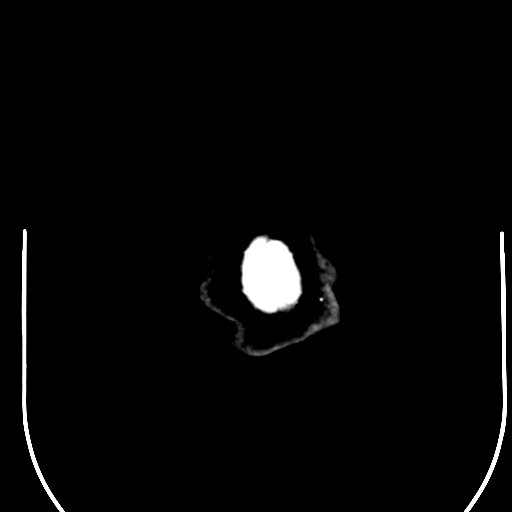

[Series 4: coronal soft · coronal · 0.35mm/px · 3 of 74 slices shown]
[im 25/74  brain]
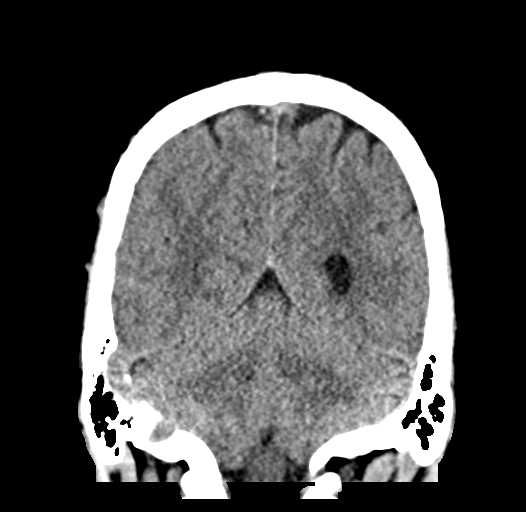
[im 33/74  brain]
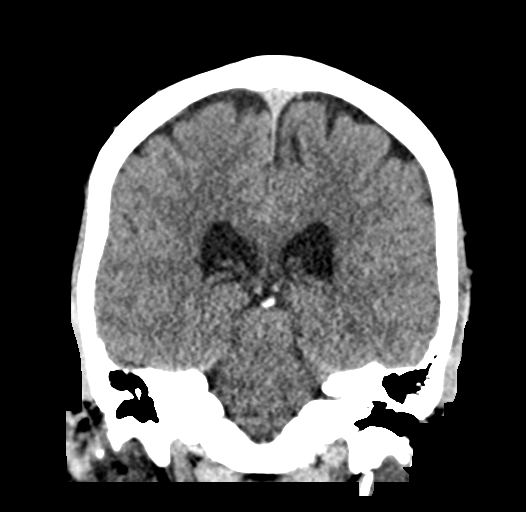
[im 41/74  brain]
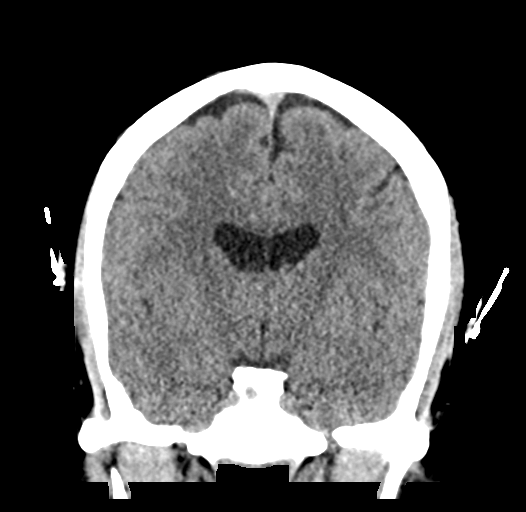

[Series 5: sag soft · sagittal · 0.35mm/px · 3 of 59 slices shown]
[im 20/59  brain]
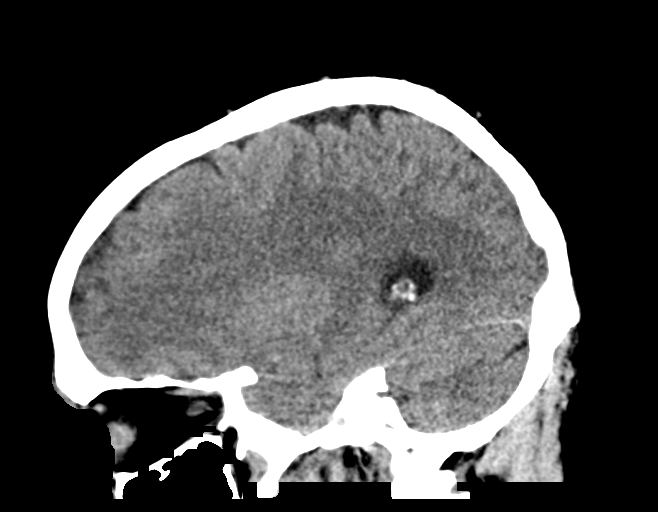
[im 30/59  brain]
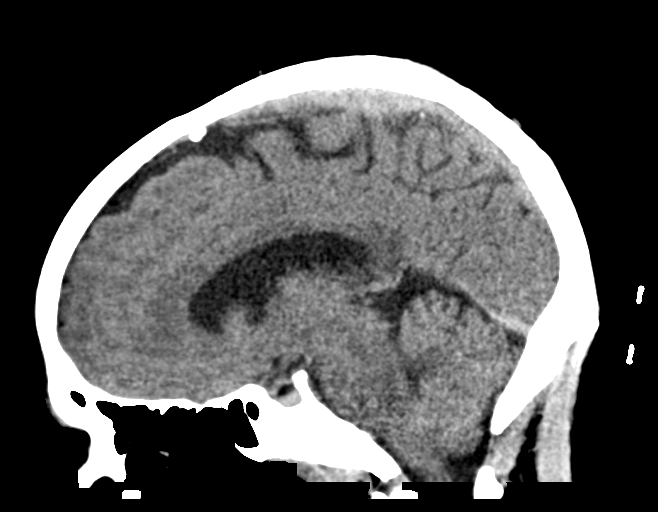
[im 39/59  brain]
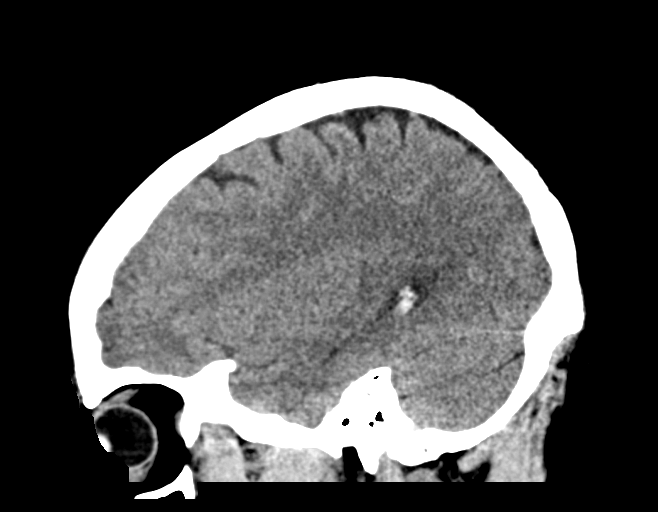

[16 of 47 positions shown; findings below may reference images not displayed]

FINDINGS: Brain: No evidence of acute infarction, hemorrhage, hydrocephalus,
extra-axial collection or mass lesion/mass effect.

Vascular: No hyperdense vessel or unexpected calcification.

Skull: Normal. Negative for fracture or focal lesion.

Sinuses/Orbits: No acute finding.

Other: None.
IMPRESSION: No acute intracranial abnormalities.

## 2024-01-13 NOTE — ED Provider Notes (Signed)
 ------------------------------------------------------------------------------- Attestation signed by Cesar FORBES Kluver, MD at 01/15/2024  4:31 PM Shared service with APP.  I have personally seen and examined the patient, providing direct face to face care. I performed a substantive portion of the medical decision making for the patient encounter, as documented by the APP.    Patient's presentation is most consistent with acute complicated illness / injury requiring diagnostic workup.  Cesar FORBES Kluver, MD -------------------------------------------------------------------------------            Chief Complaint  Patient presents with  . Shortness of Breath       HPI The patient is a 38 y.o. female with a PMH significant for HTN, migraine headache, and obesity who presents today for shortness of breath. The patient reports that she was seen in the emergency department earlier today for anxiety.  She does not historically have a history of anxiety but has had felt increased anxiousness and mood swings recently.  She was prescribed Lexapro.  She was also represcribed metronidazole  for previous bacterial vaginosis that she did not finish her previous prescription for.  The patient notes that afterwards she felt short of breath and notes nausea as well as mild stomach upset.  She states that she has a history of gallstones and wonders if this could be contributing to her symptoms. She also reports that she missed 1 dose of Wegovy.  She is on this medication for weight loss.  She is followed by her primary care provider.  She does report that she had an episode of blood in her bowel movement several months ago.  She states this was evaluated by her primary care provider and she was told that she had a hemorrhoid which likely explain this.  She notes that her blood work this morning was reassuring except for being slightly low and potassium for which she was given a pill for.  She is concerned that  her prescribed Lexapro will not kick in for 2 weeks and that she will need another medication in order to help with anxiousness in the meantime.  She denies SI, HI, and AVH.  Her husband is at bedside and denies any concerns in the emergency department today.  She is status post tubal ligation and denies pregnancy.   History provided by:  Patient, medical records and spouse     Patient History Past Medical History:  Diagnosis Date  . Hypertension   . Migraines    Past Surgical History:  Procedure Laterality Date  . CESAREAN SECTION, UNSPECIFIED     Procedure: CESAREAN SECTION  . ECTOPIC PREGNANCY     Procedure: ECTOPIC PREGNANCY SURGERY  . TUBAL LIGATION     Family History  Problem Relation Name Age of Onset  . End stage renal disease Mother    . Diabetes Mother     Social History   Tobacco Use  . Smoking status: Every Day    Passive exposure: Current  . Smokeless tobacco: Never  Vaping Use  . Vaping status: Never Used  Substance Use Topics  . Alcohol use: Not Currently  . Drug use: Not Currently      Review of Systems Review of Systems  Constitutional:  Negative for chills, diaphoresis, fatigue and fever.  Respiratory:  Positive for shortness of breath. Negative for cough.   Cardiovascular:  Negative for chest pain.  Gastrointestinal:  Positive for abdominal pain, blood in stool (a few months ago, seen by PCP for same) and nausea. Negative for diarrhea and vomiting.  Genitourinary:  Positive for vaginal discharge (history of BV, prescribed Flagyl  earlier today). Negative for decreased urine volume and dysuria.  Musculoskeletal:  Negative for arthralgias and myalgias.  Skin:  Negative for rash.  Allergic/Immunologic: Negative for immunocompromised state.  Neurological:  Negative for headaches.  Hematological:  Does not bruise/bleed easily.  Psychiatric/Behavioral:  The patient is nervous/anxious.       Physical Exam ED Triage Vitals [01/13/24 1933]  Temp 97.4  F (36.3 C)  Heart Rate 80  Resp 16  BP 123/90  MAP (mmHg) 98  SpO2 100 %  O2 Device None (Room air)  O2 Flow Rate (L/min)   Weight 118 kg (260 lb)   Physical Exam Vitals and nursing note reviewed.  Constitutional:      General: She is not in acute distress.    Appearance: Normal appearance. She is obese. She is not ill-appearing, toxic-appearing or diaphoretic.  HENT:     Head: Normocephalic and atraumatic.     Mouth/Throat:     Mouth: Mucous membranes are moist.     Pharynx: Oropharynx is clear.  Eyes:     Conjunctiva/sclera: Conjunctivae normal.  Neck:     Comments: No JVD. Cardiovascular:     Rate and Rhythm: Normal rate and regular rhythm.     Pulses:          Radial pulses are 2+ on the right side and 2+ on the left side.       Dorsalis pedis pulses are 2+ on the right side and 2+ on the left side.       Posterior tibial pulses are 2+ on the right side and 2+ on the left side.  Pulmonary:     Effort: Pulmonary effort is normal.     Breath sounds: Normal breath sounds.  Abdominal:     General: Bowel sounds are normal. There is no distension.     Palpations: Abdomen is soft.     Tenderness: There is no abdominal tenderness. There is no right CVA tenderness, left CVA tenderness, guarding or rebound. Negative signs include Murphy's sign.  Musculoskeletal:     Cervical back: Normal range of motion and neck supple. No rigidity.     Comments: No lower extremity pitting edema. Calves grossly symmetric and non-tender.  Skin:    General: Skin is warm and dry.  Neurological:     Mental Status: She is alert and oriented to person, place, and time.  Psychiatric:        Mood and Affect: Mood is anxious.     Comments: Denies SI, HI, and AVH. Not responding to internal stimuli.         CHA2DS2-VASc Score: N/A  Glasgow Coma Scale Score: 15                 Procedures                       ED Course & MDM   Medical Decision Making Problems Addressed: Anxiousness:  complicated acute illness or injury  Amount and/or Complexity of Data Reviewed Independent Historian: spouse External Data Reviewed: labs, radiology, ECG and notes. Labs: ordered. Decision-making details documented in ED Course. Radiology: ordered and independent interpretation performed. Decision-making details documented in ED Course. ECG/medicine tests: ordered and independent interpretation performed. Decision-making details documented in ED Course.  Risk Prescription drug management.     The patient is a 38 y.o. female with a PMH significant for HTN, migraine headache, and obesity who presents  today for shortness of breath. Discussed the patient with Dr. Sherrod, MD who independently assessed the patient.  HDS. GCS 15. No acute distress.   Shortness of breath -- Experienced early today. Lungs CTAB. Plan for EKG and CXR. COVID and Influenza ordered from Triage. EKG: NSR, rate 70. T wave flattening in III. Not suggestive of acute ischemia, doubt ACS. CXR: There is no evidence of acute cardiac or pulmonary abnormality. No PNA, PTX, pleural effusion, pulmonary edema. I have independently reviewed the patient's imaging and agree with the radiologist's read. No wheezing, doubt RAD, asthma, and COPD. The patient does not appear volume up, doubt CHF. ECHO from August 2024: EF 55-60%. Negative PERC and WELLS criteria, doubt PE. COVID and Influenza negative.   Nausea -- History of gallstones. She states that she had nausea and that her stomach felt sour today. Treated nausea in the ED. She is unsure if this was related to her anxiousness. Labs from earlier today reviewed. No leukocytosis. No AKI. No elevated LFTs. Abdomen soft and non-tender in the ED. No RUQ tenderness and negative Murphy's sign, doubt symptomatic gallstones, cholecystitis, choledocholithiasis, or chokangitis. No epigasric or LUQ tenderness, doubt acute pancreatitis. UA from earlier today, not suggestive of infection. Additionally,  the patient notes an episode of blood in her BM several months ago. No anemia on labs from earlier today. Reasonable to follow-up with PCP in the outpatient setting.   Anxiousness -- Prescribed Lexapro this morning. She is concerned that she needs medication to help while this medication takes some time to start working. Atarax given in the ED. RX for PRN at home prescribed. She denies SI, HI, and AVH. Husband at bedside does not voice any safety concerns. I do not feel that the patient is a danger to herself and/or others at this time. No emergent indication for hold or PSY consult from the ED today. Strict ED return precautions discussed.   Discussed the plan with the patient, including information to follow-up with PCP. The patient's vitals are stable at the time of discharge. The patient understands the plan and instructions on reasons to return to the ED. The patient is safe for discharge to home.   Patient's presentation is most consistent with acute presentation with potential threat to life or bodily function.  ED Disposition:  Discharge  Final diagnoses:  Anxiousness   ED Prescriptions     Medication Sig Dispense Start Date End Date Auth. Provider   hydrOXYzine (ATARAX) 25 mg tablet Take 1 tablet (25 mg total) by mouth every 8 (eight) hours as needed for anxiety. 20 tablet 01/13/2024 -- Sherrilyn Sprang Arvilla Bearded, PA-C

## 2024-01-14 ENCOUNTER — Encounter (HOSPITAL_BASED_OUTPATIENT_CLINIC_OR_DEPARTMENT_OTHER): Payer: Self-pay | Admitting: Emergency Medicine

## 2024-01-14 ENCOUNTER — Emergency Department (HOSPITAL_BASED_OUTPATIENT_CLINIC_OR_DEPARTMENT_OTHER)
Admission: EM | Admit: 2024-01-14 | Discharge: 2024-01-14 | Disposition: A | Discharge status: Home / Self Care | Attending: Emergency Medicine | Admitting: Emergency Medicine

## 2024-01-14 ENCOUNTER — Other Ambulatory Visit: Payer: Self-pay

## 2024-01-14 DIAGNOSIS — R1013 Epigastric pain: Secondary | ICD-10-CM | POA: Insufficient documentation

## 2024-01-14 DIAGNOSIS — R197 Diarrhea, unspecified: Secondary | ICD-10-CM | POA: Diagnosis not present

## 2024-01-14 DIAGNOSIS — R1084 Generalized abdominal pain: Secondary | ICD-10-CM | POA: Insufficient documentation

## 2024-01-14 DIAGNOSIS — E876 Hypokalemia: Secondary | ICD-10-CM | POA: Diagnosis not present

## 2024-01-14 DIAGNOSIS — R112 Nausea with vomiting, unspecified: Secondary | ICD-10-CM | POA: Diagnosis present

## 2024-01-14 LAB — URINALYSIS, ROUTINE W REFLEX MICROSCOPIC
Bilirubin Urine: NEGATIVE
Glucose, UA: NEGATIVE mg/dL
Hgb urine dipstick: NEGATIVE
Ketones, ur: NEGATIVE mg/dL
Leukocytes,Ua: NEGATIVE
Nitrite: NEGATIVE
Protein, ur: NEGATIVE mg/dL
Specific Gravity, Urine: 1.02 (ref 1.005–1.030)
pH: 7 (ref 5.0–8.0)

## 2024-01-14 LAB — COMPREHENSIVE METABOLIC PANEL WITH GFR
ALT: 15 U/L (ref 0–44)
AST: 15 U/L (ref 15–41)
Albumin: 3.3 g/dL — ABNORMAL LOW (ref 3.5–5.0)
Alkaline Phosphatase: 48 U/L (ref 38–126)
Anion gap: 11 (ref 5–15)
BUN: 8 mg/dL (ref 6–20)
CO2: 26 mmol/L (ref 22–32)
Calcium: 8.8 mg/dL — ABNORMAL LOW (ref 8.9–10.3)
Chloride: 100 mmol/L (ref 98–111)
Creatinine, Ser: 0.71 mg/dL (ref 0.44–1.00)
GFR, Estimated: >60 mL/min (ref >60)
Glucose, Bld: 99 mg/dL (ref 70–99)
Potassium: 3 mmol/L — ABNORMAL LOW (ref 3.5–5.1)
Sodium: 137 mmol/L (ref 135–145)
Total Bilirubin: 0.7 mg/dL (ref 0.0–1.2)
Total Protein: 7.2 g/dL (ref 6.5–8.1)

## 2024-01-14 LAB — CBC
HCT: 40.6 % (ref 36.0–46.0)
Hemoglobin: 13.9 g/dL (ref 12.0–15.0)
MCH: 30 pg (ref 26.0–34.0)
MCHC: 34.2 g/dL (ref 30.0–36.0)
MCV: 87.7 fL (ref 80.0–100.0)
Platelets: 285 10*3/uL (ref 150–400)
RBC: 4.63 MIL/uL (ref 3.87–5.11)
RDW: 13.2 % (ref 11.5–15.5)
WBC: 7.5 10*3/uL (ref 4.0–10.5)
nRBC: 0 % (ref 0.0–0.2)

## 2024-01-14 LAB — RESP PANEL BY RT-PCR (RSV, FLU A&B, COVID)  RVPGX2
Influenza A by PCR: NEGATIVE
Influenza B by PCR: NEGATIVE
Resp Syncytial Virus by PCR: NEGATIVE
SARS Coronavirus 2 by RT PCR: NEGATIVE

## 2024-01-14 LAB — LIPASE, BLOOD: Lipase: 45 U/L (ref 11–51)

## 2024-01-14 LAB — PREGNANCY, URINE: Preg Test, Ur: NEGATIVE

## 2024-01-14 MED ORDER — ONDANSETRON HCL 4 MG/2ML IJ SOLN
4.0000 mg | Freq: Once | INTRAMUSCULAR | Status: AC
  Administered 2024-01-14: 4 mg via INTRAVENOUS
  Filled 2024-01-14: qty 2

## 2024-01-14 MED ORDER — POTASSIUM CHLORIDE CRYS ER 20 MEQ PO TBCR
40.0000 meq | EXTENDED_RELEASE_TABLET | Freq: Once | ORAL | Status: AC
  Administered 2024-01-14: 40 meq via ORAL
  Filled 2024-01-14: qty 2

## 2024-01-14 MED ORDER — METOCLOPRAMIDE HCL 5 MG/ML IJ SOLN
10.0000 mg | Freq: Once | INTRAMUSCULAR | Status: AC
  Administered 2024-01-14: 10 mg via INTRAVENOUS
  Filled 2024-01-14: qty 2

## 2024-01-14 MED ORDER — KETOROLAC TROMETHAMINE 15 MG/ML IJ SOLN: 15.0000 mg | Freq: Once | INTRAMUSCULAR | Status: DC

## 2024-01-14 MED ORDER — POTASSIUM CHLORIDE CRYS ER 20 MEQ PO TBCR
20.0000 meq | EXTENDED_RELEASE_TABLET | Freq: Two times a day (BID) | ORAL | 0 refills | Status: AC
Start: 2024-01-14 — End: ?

## 2024-01-14 MED ORDER — ONDANSETRON HCL 4 MG PO TABS: 4.0000 mg | ORAL_TABLET | Freq: Four times a day (QID) | ORAL | 0 refills | Status: AC

## 2024-01-14 MED ORDER — SODIUM CHLORIDE 0.9 % IV BOLUS
500.0000 mL | Freq: Once | INTRAVENOUS | Status: AC
  Administered 2024-01-14: 500 mL via INTRAVENOUS

## 2024-01-14 NOTE — ED Provider Notes (Signed)
 [facility] Provider Note   CSN: [id number] Arrival date & time: 01/14/24  1258     History  Chief Complaint  Patient presents with   Abdominal Pain   Emesis    Dawn Bowman is a 38 y.o. female presents with nausea, vomiting and diarrhea for the past 3 days.  She does endorse some mild generalized abdominal pain as well.  No specific aggravating or relieving factors.  She does report that she has been on [facility] since September and has had rather persistent nausea since then.  She is on Reglan consistently for this.  She states she did not take her Reginal Lutes this past Monday but still feels nauseous.  No prior abdominal surgeries.  She denies any vaginal or urinary symptoms.  She does report that she has intermittently felt little dizzy and short of breath at times.  No exertional symptoms.  Denies any cardiac history or prior blood clots.   Abdominal Pain Associated symptoms: vomiting   Emesis Associated symptoms: abdominal pain    Past Medical History:  Diagnosis Date   Migraine        Home Medications Prior to Admission medications   Medication Sig Start Date End Date Taking? Authorizing Provider  ondansetron (ZOFRAN) 4 MG tablet Take 1 tablet (4 mg total) by mouth every 6 (six) hours. 01/14/24  Yes Halford Decamp, PA-C  potassium chloride SA (KLOR-CON M) 20 MEQ tablet Take 1 tablet (20 mEq total) by mouth 2 (two) times daily. 01/14/24  Yes Halford Decamp, PA-C  doxycycline (VIBRAMYCIN) 100 MG capsule Take 1 capsule (100 mg total) by mouth 2 (two) times daily. 10/26/21   Benjiman Core, MD  fluconazole (DIFLUCAN) 150 MG tablet Take 1 tablet as needed for vaginal yeast infection.  May repeat in 3 days if symptoms persist. 08/11/21   Molpus, Jonny Ruiz, MD  ibuprofen (ADVIL) 800 MG tablet Take 1 tablet (800 mg total) by mouth every 8 (eight) hours as needed (for pain). 08/11/21   Molpus, Jonny Ruiz, MD  ondansetron (ZOFRAN-ODT) 4 MG  disintegrating tablet Take 1 tablet (4 mg total) by mouth every 8 (eight) hours as needed for nausea or vomiting. 12/21/21   Vanetta Mulders, MD  SUMAtriptan (IMITREX) 50 MG tablet Take 2 tablets (100 mg total) by mouth once as needed for up to 1 dose for migraine. May repeat in 2 hours if headache persists or recurs. 03/30/22   Prosperi, Christian H, PA-C      Allergies    Banana    Review of Systems   Review of Systems  Gastrointestinal:  Positive for abdominal pain and vomiting.    Physical Exam Updated Vital Signs BP (!) 134/102 (BP Location: Right Wrist)   Pulse 83   Temp 98.4 F (36.9 C) (Oral)   Resp 18   Ht 5\' 10"  (1.778 m)   Wt 122.5 kg   SpO2 99%   BMI 38.74 kg/m  Physical Exam Vitals and nursing note reviewed.  Constitutional:      General: She is not in acute distress.    Appearance: She is well-developed.  HENT:     Head: Normocephalic and atraumatic.  Eyes:     Conjunctiva/sclera: Conjunctivae normal.  Cardiovascular:     Rate and Rhythm: Normal rate and regular rhythm.     Heart sounds: No murmur heard. Pulmonary:     Effort: Pulmonary effort is normal. No respiratory distress.     Breath sounds: Normal breath sounds.  Abdominal:     Palpations: Abdomen is soft.     Tenderness: There is abdominal tenderness.     Comments: Mild generalized tenderness appears to be worse epigastric, with guarding, no rebound, no tenderness to McBurney's point, negative CVAT  Musculoskeletal:        General: No swelling.     Cervical back: Neck supple.  Skin:    General: Skin is warm and dry.     Capillary Refill: Capillary refill takes less than 2 seconds.  Neurological:     Mental Status: She is alert.  Psychiatric:        Mood and Affect: Mood normal.     ED Results / Procedures / Treatments   Labs (all labs ordered are listed, but only abnormal results are displayed) Labs Reviewed  COMPREHENSIVE METABOLIC PANEL WITH GFR - Abnormal; Notable for the following  components:      Result Value   Potassium 3.0 (*)    Calcium 8.8 (*)    Albumin 3.3 (*)    All other components within normal limits  RESP PANEL BY RT-PCR (RSV, FLU A&B, COVID)  RVPGX2  LIPASE, BLOOD  CBC  URINALYSIS, ROUTINE W REFLEX MICROSCOPIC  PREGNANCY, URINE    EKG None  Radiology No results found.  Procedures Procedures    Medications Ordered in ED Medications  ketorolac (TORADOL) 15 MG/ML injection 15 mg (15 mg Intravenous Patient Refused/Not Given 01/14/24 1432)  metoCLOPramide (REGLAN) injection 10 mg (has no administration in time range)  potassium chloride SA (KLOR-CON M) CR tablet 40 mEq (has no administration in time range)  ondansetron (ZOFRAN) injection 4 mg (4 mg Intravenous Given 01/14/24 1440)  sodium chloride 0.9 % bolus 500 mL (500 mLs Intravenous New Bag/Given 01/14/24 1631)    ED Course/ Medical Decision Making/ A&P                                 Medical Decision Making Amount and/or Complexity of Data Reviewed Labs: ordered.   This patient presents to the ED with chief complaint(s) of abdominal pain, nausea and vomiting.  The complaint involves an extensive differential diagnosis and also carries with it a high risk of complications and morbidity.   pertinent past medical history as listed in HPI  The differential diagnosis includes  gastroenteritis, colitis, small bowel obstruction, appendicitis, cholecystitis, hepatobiliary pathology, gastritis, PUD , ACS, diverticulosis/diverticulitis, pancreatitis, nephrolithiasis,   UTI, pyelonephritis,  Additional history obtained: Records reviewed Care Everywhere/External Records  Initial Assessment:   Patient presents hypertensive 134/102 with complaints of mild abdominal pain with associated nausea, vomiting and diarrhea for the past 3 days.  States vomiting and diarrhea have predominantly improved but she still feels a little nauseous.  On exam she has mild generalized tenderness that appears to be worse  epigastric. No gallstone or evidence of cholecystitis on POCUS. She has no urinary symptoms to suggest cystitis or pyelonephritis.  Negative CVAT.  She has no cough or congestion to suggest URI.  She does additionally endorse some mild shortness of breath and intermittent lightheadedness.  This comes and goes and is not associated with exertion.  She has no cardiac history of prior blood clots.  Yesterday she states she did feel little chest tightness but none today.  Independent ECG interpretation:  Normal sinus rhythm with borderline T wave abnormalities  Independent labs interpretation:  The following labs were independently interpreted:  Respiratory panel negative, CBC and UA  unremarkable, lipase within normal limits, CMP with mild hypokalemia  Independent visualization and interpretation of imaging: none  Treatment and Reassessment: Patient declined Toradol, given Zofran following for assessment  Upon reexamination she is adamant she needs fluids for dehydration.  Vitals and lab work without any evidence of profound dehydration.  Will give a half bolus of normal saline and with persistent nausea will give Reglan and discharged home.  Consultations obtained:   none  Disposition:   Patient discharged home.  Prescription for Zofran and potassium supplementation sent to pharmacy. The patient has been appropriately medically screened and/or stabilized in the ED. I have low suspicion for any other emergent medical condition which would require further screening, evaluation or treatment in the ED or require inpatient management. At time of discharge the patient is hemodynamically stable and in no acute distress. I have discussed work-up results and diagnosis with patient and answered all questions. Patient is agreeable with discharge plan. We discussed strict return precautions for returning to the emergency department and they verbalized understanding.     Social Determinants of Health:    none  This note was dictated with voice recognition software.  Despite best efforts at proofreading, errors may have occurred which can change the documentation meaning.          Final Clinical Impression(s) / ED Diagnoses Final diagnoses:  Nausea vomiting and diarrhea  Hypokalemia    Rx / DC Orders ED Discharge Orders          Ordered    potassium chloride SA (KLOR-CON M) 20 MEQ tablet  2 times daily        01/14/24 1623    ondansetron (ZOFRAN) 4 MG tablet  Every 6 hours        01/14/24 1623              Halford Decamp, PA-C 01/14/24 1642    Rolan Bucco, MD 01/17/24 [phone]

## 2024-01-14 NOTE — ED Notes (Signed)
 Provider taking [place] to bedside, will attempt IV afterwards.

## 2024-01-14 NOTE — ED Notes (Signed)
 Pt feels weak and jittery, and nauseated. No current abd pain. Wants to quit taking Wegovy but is afraid to cold-turkey stop it and asked about titrating off the medicine.

## 2024-01-14 NOTE — Discharge Instructions (Addendum)
 You were evaluated in the emergency room for abdominal pain, nausea vomiting diarrhea.  Your lab work did not show any acute abnormality.  Prescription for Zofran was sent to your pharmacy for nausea.  Additionally prescription for potassium supplementation was sent into your pharmacy as her potassium was mildly low.  These follow-up with your primary care doctor to ensure your symptoms are improving and your potassium has returned to normal limits.

## 2024-01-14 NOTE — ED Notes (Signed)

## 2024-01-14 NOTE — ED Notes (Signed)
 ED Provider at bedside.

## 2024-01-14 NOTE — ED Triage Notes (Signed)
 Pt reports "stomach issues for a few weeks", states started taking Weygovy in Sept and had lots of GI distress, stopped taking on Monday, having constant n/v/d, is taking 10mg  Reglan PRN at home, last dose at 1100  Was seen in ER yesterday, was told it was a panic attack

## 2024-03-16 ENCOUNTER — Emergency Department (HOSPITAL_BASED_OUTPATIENT_CLINIC_OR_DEPARTMENT_OTHER): Admission: EM | Admit: 2024-03-16 | Discharge: 2024-03-16 | Disposition: A | Discharge status: Home / Self Care

## 2024-03-16 ENCOUNTER — Encounter (HOSPITAL_BASED_OUTPATIENT_CLINIC_OR_DEPARTMENT_OTHER): Payer: Self-pay | Admitting: Emergency Medicine

## 2024-03-16 DIAGNOSIS — L02415 Cutaneous abscess of right lower limb: Secondary | ICD-10-CM | POA: Insufficient documentation

## 2024-03-16 DIAGNOSIS — L0291 Cutaneous abscess, unspecified: Secondary | ICD-10-CM

## 2024-03-16 MED ORDER — LIDOCAINE-EPINEPHRINE-TETRACAINE (LET) TOPICAL GEL
3.0000 mL | Freq: Once | TOPICAL | Status: AC
  Administered 2024-03-16: 3 mL via TOPICAL

## 2024-03-16 MED ORDER — PENTAFLUOROPROP-TETRAFLUOROETH EX AERO: INHALATION_SPRAY | Freq: Once | CUTANEOUS | Status: DC

## 2024-03-16 MED ORDER — BENZOCAINE 20 % MT AERO
INHALATION_SPRAY | Freq: Once | OROMUCOSAL | Status: AC
  Filled 2024-03-16: qty 57

## 2024-03-16 MED ORDER — LIDOCAINE-EPINEPHRINE (PF) 2 %-1:200000 IJ SOLN
10.0000 mL | Freq: Once | INTRAMUSCULAR | Status: DC
  Filled 2024-03-16: qty 20

## 2024-03-16 MED ORDER — DOXYCYCLINE HYCLATE 100 MG PO CAPS: 100.0000 mg | ORAL_CAPSULE | Freq: Two times a day (BID) | ORAL | 0 refills | Status: DC

## 2024-03-16 NOTE — ED Notes (Signed)
 Pt alert and oriented X 4 at the time of discharge. RR even and unlabored. No acute distress noted. Pt verbalized understanding of discharge instructions as discussed. Pt ambulatory to lobby at time of discharge.

## 2024-03-16 NOTE — Discharge Instructions (Signed)
 As discussed, your abscess was drained in the emergency department.  Continue to wash area gently with warm soapy water.   Recommend daily bandage changes.  You may take Tylenol , ibuprofen  for pain.  Will put on antibiotics given concern for infection.  Recommend follow-up with PCP/dermatology in the outpatient setting for reassessment.  Please not hesitate to return if the worrisome signs and symptoms we discussed become apparent.

## 2024-03-16 NOTE — ED Triage Notes (Signed)
 Pt c/o abscess to RT thigh/groin x 1 wk

## 2024-03-16 NOTE — ED Provider Notes (Signed)
 [place] [facility] Provider Note   CSN: [id number] Arrival date & time: 03/16/24  1242     History  Chief Complaint  Patient presents with   Abscess    Dawn Bowman is a 38 y.o. female.   Abscess   38 year old female presents emergency department complaints of abscess to right thigh/groin.  States she has a history of hidradenitis suppurativa and has had multiple abscesses in her armpit, breast, groin region.  States that her current symptoms been present for the past week or so.  Has been trying warm compresses and has not been able to get the area to drain.  Denies any fevers, chills.  States it feels like she has an abscess.  Past medical history significant for migraine, hidradenitis suppurativa.  Home Medications Prior to Admission medications   Medication Sig Start Date End Date Taking? Authorizing Provider  doxycycline  (VIBRAMYCIN ) 100 MG capsule Take 1 capsule (100 mg total) by mouth 2 (two) times daily. 10/26/21   Mozell Arias, MD  fluconazole  (DIFLUCAN ) 150 MG tablet Take 1 tablet as needed for vaginal yeast infection.  May repeat in 3 days if symptoms persist. 08/11/21   Molpus, Autry Legions, MD  ibuprofen  (ADVIL ) 800 MG tablet Take 1 tablet (800 mg total) by mouth every 8 (eight) hours as needed (for pain). 08/11/21   Molpus, John, MD  ondansetron  (ZOFRAN ) 4 MG tablet Take 1 tablet (4 mg total) by mouth every 6 (six) hours. 01/14/24   Felicie Horning, PA-C  ondansetron  (ZOFRAN -ODT) 4 MG disintegrating tablet Take 1 tablet (4 mg total) by mouth every 8 (eight) hours as needed for nausea or vomiting. 12/21/21   Zackowski, Scott, MD  potassium chloride  SA (KLOR-CON  M) 20 MEQ tablet Take 1 tablet (20 mEq total) by mouth 2 (two) times daily. 01/14/24   Felicie Horning, PA-C  SUMAtriptan  (IMITREX ) 50 MG tablet Take 2 tablets (100 mg total) by mouth once as needed for up to 1 dose for migraine. May repeat in 2 hours if headache persists or  recurs. 03/30/22   Prosperi, Christian H, PA-C      Allergies    Banana    Review of Systems   Review of Systems  All other systems reviewed and are negative.   Physical Exam Updated Vital Signs BP 119/87 (BP Location: Right Arm)   Pulse (!) 103   Temp 97.8 F (36.6 C)   Resp 20   Ht 5\' 10"  (1.778 m)   Wt 122.5 kg   SpO2 98%   BMI 38.75 kg/m  Physical Exam Vitals and nursing note reviewed.  Constitutional:      General: She is not in acute distress.    Appearance: She is well-developed.  HENT:     Head: Normocephalic and atraumatic.  Eyes:     Conjunctiva/sclera: Conjunctivae normal.  Cardiovascular:     Rate and Rhythm: Normal rate and regular rhythm.     Heart sounds: No murmur heard. Pulmonary:     Effort: Pulmonary effort is normal. No respiratory distress.     Breath sounds: Normal breath sounds.  Abdominal:     Palpations: Abdomen is soft.     Tenderness: There is no abdominal tenderness.  Musculoskeletal:        General: No swelling.     Cervical back: Neck supple.  Skin:    General: Skin is warm and dry.     Capillary Refill: Capillary refill takes less than 2 seconds.  Comments: Patient with 2.6 cm area of palpable fluctuance left inguinal region.  Surrounding induration as well as erythema.  Tenderness with touch.  No obvious expressible drainage.  Bedside ultrasound performed showed fluid collection present.  Neurological:     Mental Status: She is alert.  Psychiatric:        Mood and Affect: Mood normal.     ED Results / Procedures / Treatments   Labs (all labs ordered are listed, but only abnormal results are displayed) Labs Reviewed - No data to display  EKG None  Radiology No results found.  Procedures .Incision and Drainage  Date/Time: 03/16/2024 2:43 PM  Performed by: [place] Butter, PA Authorized by: [place] Butter, PA   Consent:    Consent obtained:  Verbal   Consent given by:  Patient   Risks discussed:  Bleeding,  incomplete drainage, pain and damage to other organs   Alternatives discussed:  No treatment Universal protocol:    Procedure explained and questions answered to patient or proxy's satisfaction: yes     Relevant documents present and verified: yes     Test results available : yes     Imaging studies available: yes     Required blood products, implants, devices, and special equipment available: yes     Site/side marked: yes     Immediately prior to procedure, a time out was called: yes     Patient identity confirmed:  Verbally with patient Location:    Type:  Abscess   Size:  2.6   Location:  Anogenital   Anogenital location: R inguinal region. Pre-procedure details:    Skin preparation:  Chlorhexidine Anesthesia:    Anesthesia method:  Topical application   Topical anesthetic:  LET Procedure type:    Complexity:  Complex Procedure details:    Incision types:  Stab incision   Incision depth:  Subcutaneous   Wound management:  Probed and deloculated, irrigated with saline and extensive cleaning   Drainage:  Purulent   Drainage amount:  Moderate   Packing materials:  None Post-procedure details:    Procedure completion:  Tolerated well, no immediate complications     Medications Ordered in ED Medications  Benzocaine (HURRCAINE) 20 % mouth spray (has no administration in time range)  lidocaine-EPINEPHrine-tetracaine (LET) topical gel (3 mLs Topical Given 03/16/24 1318)    ED Course/ Medical Decision Making/ A&P                                 Medical Decision Making Risk OTC drugs. Prescription drug management.   This patient presents to the ED for concern of abscess, this involves an extensive number of treatment options, and is a complaint that carries with it a high risk of complications and morbidity.  The differential diagnosis includes cellulitis, erysipelas, necrotizing infection, other   Co morbidities that complicate the patient evaluation  See  HPI   Additional history obtained:  Additional history obtained from EMR External records from outside source obtained and reviewed including hospital records   Lab Tests:  N/a   Imaging Studies ordered:  N/a   Cardiac Monitoring: / EKG:  N/a   Consultations Obtained:  N/a   Problem List / ED Course / Critical interventions / Medication management  Abscess I ordered medication including let gel, benzocaine spray  Reevaluation of the patient after these medicines showed that the patient improved I have reviewed the patients home medicines  and have made adjustments as needed   Social Determinants of Health:  Chronic cigarette use.  Denies illicit drug use.   Test / Admission - Considered:  Abscess Vitals signs within normal range and stable throughout visit. 38 year old female presents emergency department complaints of abscess to right thigh/groin.  States she has a history of hidradenitis suppurativa and has had multiple abscesses in her armpit, breast, groin region.  States that her current symptoms been present for the past week or so.  Has been trying warm compresses and has not been able to get the area to drain.  Denies any fevers, chills.  States it feels like she has an abscess. On exam, area of palpable fluctuance like inguinal region.  Bedside ultrasound performed confirmed clinical suspicion for fluid collection/abscess.  Area incised and drained in manner as above.  Will place patient on antibiotics given indurated/erythematous surrounding skin.  Patient did not meet SIRS criteria so sepsis protocol was not performed.  Will recommend local wound care at home and follow-up with PCP/dermatology.  Treatment plan discussed with patient and she acknowledged understanding was agreeable to said plan.  Patient overall well-appearing, afebrile in no acute distress. Worrisome signs and symptoms were discussed with the patient, and the patient acknowledged  understanding to return to the ED if noticed. Patient was stable upon discharge.          Final Clinical Impression(s) / ED Diagnoses Final diagnoses:  None    Rx / DC Orders ED Discharge Orders     None         [place] Butter, [place] 03/16/24 1445    Rolinda Climes, DO 03/16/24 1531

## 2024-08-13 ENCOUNTER — Other Ambulatory Visit: Payer: Self-pay

## 2024-08-13 ENCOUNTER — Emergency Department (HOSPITAL_BASED_OUTPATIENT_CLINIC_OR_DEPARTMENT_OTHER)
Admission: EM | Admit: 2024-08-13 | Discharge: 2024-08-13 | Disposition: A | Discharge status: Home / Self Care | Attending: Emergency Medicine | Admitting: Emergency Medicine

## 2024-08-13 ENCOUNTER — Other Ambulatory Visit (HOSPITAL_BASED_OUTPATIENT_CLINIC_OR_DEPARTMENT_OTHER): Payer: Self-pay

## 2024-08-13 DIAGNOSIS — N644 Mastodynia: Secondary | ICD-10-CM | POA: Diagnosis present

## 2024-08-13 DIAGNOSIS — M791 Myalgia, unspecified site: Secondary | ICD-10-CM | POA: Insufficient documentation

## 2024-08-13 DIAGNOSIS — N611 Abscess of the breast and nipple: Secondary | ICD-10-CM | POA: Insufficient documentation

## 2024-08-13 LAB — RESP PANEL BY RT-PCR (RSV, FLU A&B, COVID)  RVPGX2
Influenza A by PCR: NEGATIVE
Influenza B by PCR: NEGATIVE
Resp Syncytial Virus by PCR: NEGATIVE
SARS Coronavirus 2 by RT PCR: NEGATIVE

## 2024-08-13 MED ORDER — DOXYCYCLINE HYCLATE 100 MG PO CAPS
100.0000 mg | ORAL_CAPSULE | Freq: Two times a day (BID) | ORAL | 0 refills | Status: DC
  Filled 2024-08-13: qty 14, 7d supply, fill #0

## 2024-08-13 MED ORDER — LIDOCAINE HCL (PF) 1 % IJ SOLN
10.0000 mL | Freq: Once | INTRAMUSCULAR | Status: AC
  Administered 2024-08-13: 10 mL
  Filled 2024-08-13: qty 10

## 2024-08-13 MED ORDER — DOXYCYCLINE HYCLATE 100 MG PO CAPS: 100.0000 mg | ORAL_CAPSULE | Freq: Two times a day (BID) | ORAL | 0 refills | Status: AC

## 2024-08-13 MED ORDER — PENTAFLUOROPROP-TETRAFLUOROETH EX AERO
INHALATION_SPRAY | CUTANEOUS | Status: DC | PRN
  Administered 2024-08-13: 30 via TOPICAL
  Filled 2024-08-13: qty 30

## 2024-08-13 NOTE — Discharge Instructions (Signed)
 You were seen in the emergency room today with breast abscess.  We drained 2 areas and placed packing.  You can expect some continued drainage and occasional bleeding.  Please continue the antibiotics and return in 2 days for packing removal and reassessment.

## 2024-08-13 NOTE — ED Triage Notes (Signed)
 Pt reports bloody mucus, chills & generalized bodyaches X 1 week. Also reports left breast swelling and pain. Reports hx of abscess in breast.

## 2024-08-13 NOTE — ED Notes (Signed)
 I reviewed discharge instructions w pt, instructed her to take full course of antibiotic and return to ED in two days for wound recheck. Pt verbalized understanding and stated she will return Thursday morning.

## 2024-08-13 NOTE — ED Provider Notes (Signed)
 Emergency Department Provider Note   I have reviewed the triage vital signs and the nursing notes.   HISTORY  Chief Complaint Abscess   HPI Dawn Bowman is a 38 y.o. female presents the emergency apartment with concern for recurrent breast abscess.  She has had multiple breast abscesses on the left as well as underarms and groin.  She has been feeling increasingly fatigued.  Yesterday she had some blood clots from her nose and congestion.  No cough or sore throat.  No severe headaches.  No shortness of breath or chest pain.  Her left breast is inflamed and she feels 2 spots on her breast which feel like developing abscess. Has required I&D of these areas multiple times in the past.    Past Medical History:  Diagnosis Date   Migraine     Review of Systems  Constitutional: No fever/chills Cardiovascular: Denies chest pain. Respiratory: Denies shortness of breath. Gastrointestinal: No abdominal pain.  No nausea, no vomiting.  Musculoskeletal: Negative for back pain. Skin: Positive breast abscess.  Neurological: Negative for headaches, focal weakness or numbness.  ____________________________________________   PHYSICAL EXAM:  VITAL SIGNS: ED Triage Vitals  Encounter Vitals Group     BP 08/13/24 1021 120/82     Pulse Rate 08/13/24 1021 (!) 101     Resp 08/13/24 1021 16     Temp 08/13/24 1021 98.6 F (37 C)     Temp Source 08/13/24 1021 Oral     SpO2 08/13/24 1021 98 %   Constitutional: Alert and oriented. Well appearing and in no acute distress. Eyes: Conjunctivae are normal.  Head: Atraumatic. Nose: No congestion/rhinnorhea. Mouth/Throat: Mucous membranes are moist.   Neck: No stridor.   Cardiovascular: Normal rate, regular rhythm. Good peripheral circulation. Grossly normal heart sounds.   Respiratory: Normal respiratory effort.  No retractions. Lungs CTAB. Gastrointestinal: Soft and nontender. No distention.  Musculoskeletal: No lower extremity tenderness  nor edema. No gross deformities of extremities. Neurologic:  Normal speech and language. No gross focal neurologic deficits are appreciated.  Skin:  Skin is warm, dry and intact.  Chaperone at bedside for the breast exam.  Patient's left breast is erythematous and swollen compared to the right.  There are 2 areas of fluctuance outside of the areola on the right and left.  Bedside ultrasound confirms fluid.    ____________________________________________   LABS (all labs ordered are listed, but only abnormal results are displayed)  Labs Reviewed  RESP PANEL BY RT-PCR (RSV, FLU A&B, COVID)  [id number]   ____________________________________________  EKG  *** ____________________________________________  RADIOLOGY  No results found.  ____________________________________________   PROCEDURES  Procedure(s) performed:   .Incision and Drainage  Date/Time: 08/13/2024 11:34 AM  Performed by: Darra Fonda MATSU, MD Authorized by: Darra Fonda MATSU, MD   Consent:    Consent obtained:  Verbal   Consent given by:  Patient   Risks, benefits, and alternatives were discussed: yes     Risks discussed:  Bleeding, damage to other organs, incomplete drainage, infection and pain   Alternatives discussed:  No treatment Universal protocol:    Patient identity confirmed:  Verbally with patient Location:    Type:  Abscess   Size:  3 x 3 cm visible   Location: left breast (medial) Pre-procedure details:    Skin preparation:  Povidone-iodine Sedation:    Sedation type:  None Anesthesia:    Anesthesia method:  Local infiltration   Local anesthetic:  Lidocaine  1% w/o epi Procedure type:  Complexity:  Complex Procedure details:    Ultrasound guidance: yes     Needle aspiration: yes     Needle size:  18 G   Incision types:  Single straight   Incision depth:  Subcutaneous   Wound management:  Probed and deloculated   Drainage:  Purulent   Drainage amount:  Copious   Wound treatment:  Wound  left open   Packing materials:  1/4 in iodoform gauze   Amount 1/4 iodoform:  15 cm Post-procedure details:    Procedure completion:  Tolerated well, no immediate complications .Incision and Drainage  Date/Time: 08/13/2024 11:35 AM  Performed by: Darra Fonda MATSU, MD Authorized by: Darra Fonda MATSU, MD   Consent:    Consent obtained:  Verbal   Consent given by:  Patient   Risks, benefits, and alternatives were discussed: yes     Risks discussed:  Bleeding, damage to other organs, infection, pain and incomplete drainage   Alternatives discussed:  No treatment Universal protocol:    Patient identity confirmed:  Verbally with patient Location:    Type:  Abscess   Size:  4 x 4 cm   Location: left breast (lateral) Pre-procedure details:    Skin preparation:  Antiseptic wash Sedation:    Sedation type:  None Anesthesia:    Anesthesia method:  Local infiltration   Local anesthetic:  Lidocaine  1% w/o epi Procedure type:    Complexity:  Complex Procedure details:    Ultrasound guidance: yes     Needle aspiration: no     Incision types:  Single straight   Incision depth:  Subcutaneous   Wound management:  Probed and deloculated   Drainage:  Serous   Drainage amount:  Copious   Wound treatment:  Wound left open   Packing materials:  1/4 in iodoform gauze   Amount 1/4 iodoform:  15 cm Post-procedure details:    Procedure completion:  Tolerated well, no immediate complications   EMERGENCY DEPARTMENT US  SOFT TISSUE INTERPRETATION Study: Limited Soft Tissue Ultrasound  INDICATIONS: Pain and Soft tissue infection Multiple views of the body part were obtained in real-time with a multi-frequency linear probe PERFORMED BY:  Myself IMAGES ARCHIVED?: Yes SIDE:Left BODY PART:Breast FINDINGS: Abcess present INTERPRETATION:  Abcess present  ____________________________________________   INITIAL IMPRESSION / ASSESSMENT AND PLAN / ED COURSE  Pertinent labs & imaging results that  were available during my care of the patient were reviewed by me and considered in my medical decision making (see chart for details).   This patient is Presenting for Evaluation of breast pain, which does require a range of treatment options, and is a complaint that involves a high risk of morbidity and mortality.  The Differential Diagnoses includes abscess, cellulitis, URI, etc.   Critical Interventions-    Medications  lidocaine  (PF) (XYLOCAINE ) 1 % injection 10 mL (has no administration in time range)    Reassessment after intervention:     Clinical Laboratory Tests Ordered, included COVID PCR.   Medical Decision Making: Summary:  The patient presents emergency department with chills and bodyaches along with breast pain.  On exam appears to have 2 areas of fluid collection in the left breast with erythema.  Discussed treatment options with patient and will move forward with bedside incision and drainage under ultrasound guidance.   Reevaluation with update and discussion with patient.  She tolerated bedside incision and drainage of her 2 breast abscess area as well.  Packing placed.  Will start the patient on doxycycline  and  referred to the breast clinic.   ***Considered admission***  Patient's presentation is most consistent with acute presentation with potential threat to life or bodily function.   Disposition:   ____________________________________________  FINAL CLINICAL IMPRESSION(S) / ED DIAGNOSES  Final diagnoses:  None     NEW OUTPATIENT MEDICATIONS STARTED DURING THIS VISIT:  New Prescriptions   No medications on file    Note:  This document was prepared using Dragon voice recognition software and may include unintentional dictation errors.  Fonda Law, MD, [facility] Emergency Medicine

## 2024-08-14 ENCOUNTER — Other Ambulatory Visit: Payer: Self-pay | Admitting: Emergency Medicine

## 2024-08-14 DIAGNOSIS — N611 Abscess of the breast and nipple: Secondary | ICD-10-CM
